# Patient Record
Sex: Female | Born: 1955 | Race: White | Hispanic: No | Smoking: Never smoker
Health system: Southern US, Community
[De-identification: ages and names within clinical notes are randomized; demographics above are authoritative.]

## PROBLEM LIST (undated history)

## (undated) DIAGNOSIS — M51369 Other intervertebral disc degeneration, lumbar region without mention of lumbar back pain or lower extremity pain: Secondary | ICD-10-CM

## (undated) DIAGNOSIS — R091 Pleurisy: Secondary | ICD-10-CM

## (undated) DIAGNOSIS — K219 Gastro-esophageal reflux disease without esophagitis: Secondary | ICD-10-CM

## (undated) DIAGNOSIS — R519 Headache, unspecified: Secondary | ICD-10-CM

## (undated) DIAGNOSIS — Z86018 Personal history of other benign neoplasm: Secondary | ICD-10-CM

## (undated) DIAGNOSIS — E119 Type 2 diabetes mellitus without complications: Secondary | ICD-10-CM

## (undated) DIAGNOSIS — I10 Essential (primary) hypertension: Secondary | ICD-10-CM

## (undated) DIAGNOSIS — M5136 Other intervertebral disc degeneration, lumbar region: Secondary | ICD-10-CM

## (undated) DIAGNOSIS — M5030 Other cervical disc degeneration, unspecified cervical region: Secondary | ICD-10-CM

## (undated) DIAGNOSIS — R51 Headache: Secondary | ICD-10-CM

## (undated) HISTORY — PX: COLONOSCOPY WITH ESOPHAGOGASTRODUODENOSCOPY (EGD): SHX5779

## (undated) HISTORY — PX: ARTHROSCOPIC REPAIR ACL: SUR80

---
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
## 2007-03-31 ENCOUNTER — Ambulatory Visit: Payer: Self-pay | Admitting: Internal Medicine

## 2007-04-07 ENCOUNTER — Ambulatory Visit: Payer: Self-pay | Admitting: Internal Medicine

## 2007-10-20 ENCOUNTER — Ambulatory Visit: Payer: Self-pay | Admitting: Internal Medicine

## 2008-02-14 ENCOUNTER — Ambulatory Visit: Payer: Self-pay

## 2008-05-05 ENCOUNTER — Ambulatory Visit: Payer: Self-pay | Admitting: Internal Medicine

## 2010-10-22 ENCOUNTER — Ambulatory Visit: Payer: Self-pay | Admitting: Internal Medicine

## 2011-05-20 ENCOUNTER — Ambulatory Visit: Payer: Self-pay | Admitting: Internal Medicine

## 2011-06-02 ENCOUNTER — Ambulatory Visit: Payer: Self-pay | Admitting: Internal Medicine

## 2011-07-02 ENCOUNTER — Ambulatory Visit: Payer: Self-pay | Admitting: Internal Medicine

## 2011-08-02 ENCOUNTER — Ambulatory Visit: Payer: Self-pay | Admitting: Internal Medicine

## 2011-12-03 ENCOUNTER — Ambulatory Visit: Payer: Self-pay | Admitting: Internal Medicine

## 2013-03-17 ENCOUNTER — Ambulatory Visit: Payer: Self-pay | Admitting: Internal Medicine

## 2014-02-19 ENCOUNTER — Emergency Department: Payer: Self-pay | Admitting: Emergency Medicine

## 2014-02-19 LAB — BASIC METABOLIC PANEL
Anion Gap: 9 (ref 7–16)
BUN: 20 mg/dL — ABNORMAL HIGH (ref 7–18)
CREATININE: 1.05 mg/dL (ref 0.60–1.30)
Calcium, Total: 8.9 mg/dL (ref 8.5–10.1)
Chloride: 104 mmol/L (ref 98–107)
Co2: 25 mmol/L (ref 21–32)
EGFR (African American): >60
EGFR (Non-African Amer.): 58 — ABNORMAL LOW
Glucose: 196 mg/dL — ABNORMAL HIGH (ref 65–99)
Osmolality: 284 (ref 275–301)
Potassium: 3.8 mmol/L (ref 3.5–5.1)
Sodium: 138 mmol/L (ref 136–145)

## 2014-02-19 LAB — CBC
HCT: 46.6 % (ref 35.0–47.0)
HGB: 15.2 g/dL (ref 12.0–16.0)
MCH: 29.3 pg (ref 26.0–34.0)
MCHC: 32.7 g/dL (ref 32.0–36.0)
MCV: 90 fL (ref 80–100)
Platelet: 347 10*3/uL (ref 150–440)
RBC: 5.19 10*6/uL (ref 3.80–5.20)
RDW: 14.7 % — ABNORMAL HIGH (ref 11.5–14.5)
WBC: 13.7 10*3/uL — ABNORMAL HIGH (ref 3.6–11.0)

## 2014-02-19 LAB — TROPONIN I: Troponin-I: <0.02 ng/mL

## 2014-12-06 DIAGNOSIS — D239 Other benign neoplasm of skin, unspecified: Secondary | ICD-10-CM

## 2014-12-06 HISTORY — DX: Other benign neoplasm of skin, unspecified: D23.9

## 2015-03-06 ENCOUNTER — Other Ambulatory Visit: Payer: Self-pay | Admitting: Internal Medicine

## 2015-03-06 DIAGNOSIS — Z1231 Encounter for screening mammogram for malignant neoplasm of breast: Secondary | ICD-10-CM

## 2015-03-07 ENCOUNTER — Ambulatory Visit
Admission: RE | Admit: 2015-03-07 | Discharge: 2015-03-07 | Disposition: A | Discharge status: Home / Self Care | Payer: Managed Care, Other (non HMO) | Source: Ambulatory Visit | Attending: Internal Medicine | Admitting: Internal Medicine

## 2015-03-07 DIAGNOSIS — Z1231 Encounter for screening mammogram for malignant neoplasm of breast: Secondary | ICD-10-CM

## 2015-11-28 ENCOUNTER — Other Ambulatory Visit: Payer: Self-pay | Admitting: Internal Medicine

## 2015-11-28 ENCOUNTER — Ambulatory Visit
Admission: RE | Admit: 2015-11-28 | Discharge: 2015-11-28 | Disposition: A | Discharge status: Home / Self Care | Payer: Managed Care, Other (non HMO) | Source: Ambulatory Visit | Attending: Internal Medicine | Admitting: Internal Medicine

## 2015-11-28 DIAGNOSIS — R1012 Left upper quadrant pain: Secondary | ICD-10-CM

## 2015-11-28 DIAGNOSIS — M5137 Other intervertebral disc degeneration, lumbosacral region: Secondary | ICD-10-CM | POA: Diagnosis not present

## 2015-11-28 DIAGNOSIS — K6389 Other specified diseases of intestine: Secondary | ICD-10-CM | POA: Insufficient documentation

## 2016-06-27 ENCOUNTER — Other Ambulatory Visit: Payer: Self-pay | Admitting: Internal Medicine

## 2016-06-27 DIAGNOSIS — Z1231 Encounter for screening mammogram for malignant neoplasm of breast: Secondary | ICD-10-CM

## 2016-07-24 ENCOUNTER — Ambulatory Visit
Admission: RE | Admit: 2016-07-24 | Discharge: 2016-07-24 | Disposition: A | Discharge status: Home / Self Care | Payer: Managed Care, Other (non HMO) | Source: Ambulatory Visit | Attending: Internal Medicine | Admitting: Internal Medicine

## 2016-07-24 DIAGNOSIS — Z1231 Encounter for screening mammogram for malignant neoplasm of breast: Secondary | ICD-10-CM | POA: Insufficient documentation

## 2017-08-06 ENCOUNTER — Encounter: Admission: RE | Payer: Self-pay | Source: Ambulatory Visit

## 2017-08-06 ENCOUNTER — Ambulatory Visit
Admission: RE | Admit: 2017-08-06 | Payer: Managed Care, Other (non HMO) | Source: Ambulatory Visit | Admitting: Gastroenterology

## 2017-08-06 SURGERY — COLONOSCOPY WITH PROPOFOL
Anesthesia: General

## 2017-08-28 ENCOUNTER — Other Ambulatory Visit: Payer: Self-pay | Admitting: Internal Medicine

## 2017-08-28 DIAGNOSIS — Z1231 Encounter for screening mammogram for malignant neoplasm of breast: Secondary | ICD-10-CM

## 2017-10-29 ENCOUNTER — Telehealth: Payer: Self-pay

## 2017-10-29 NOTE — Telephone Encounter (Signed)
Received referral for EUS. Voicemail left with Ms. Krolikowski to return call for scheduling.  Oncology Nurse Navigator Documentation  Navigator Location: [facility] (10/29/17 1300)   )Navigator Encounter Type: Telephone (10/29/17 1300) Telephone: [place] Call (10/29/17 1300)                       Barriers/Navigation Needs: Coordination of Care (10/29/17 1300)   Interventions: Coordination of Care (10/29/17 1300)   Coordination of Care: EUS (10/29/17 1300)                  Time Spent with Patient: 15 (10/29/17 1300)

## 2017-10-29 NOTE — Telephone Encounter (Signed)
Received call back from Ms. Holbein. She would like to postpone scheduling of EUS until December due to financial reasons. I have placed her on callback list for December scheduling and sent a message to [facility] to notify of patient scheduling preference. Oncology Nurse Navigator Documentation  Navigator Location: [facility] (10/29/17 1400)   )  Telephone: Incoming Call (10/29/17 1400)                               Coordination of Care: EUS (10/29/17 1400)                  Time Spent with Patient: 15 (10/29/17 1400)

## 2017-12-04 ENCOUNTER — Telehealth: Payer: Self-pay

## 2017-12-04 NOTE — Telephone Encounter (Signed)
Received call back from Ms. Litzau. EUS scheduled for 11/14 with Dr. Francella Solian at [facility] over instructions and copy mailed to home address. Only anticoagulant is aspirin 81mg .  INSTRUCTIONS FOR ENDOSCOPIC ULTRASOUND -Your procedure has been scheduled for November 14th with Dr. Francella Solian [facility]. -The hospital may contact you to pre-register over the phone.  -To get your scheduled arrival time, please call the Endoscopy unit at  [phone] between 1-3 p.m. on:  November 13th   -ON THE DAY OF YOU PROCEDURE:   1. If you are scheduled for a morning procedure, nothing to drink after midnight  -If you are scheduled for an afternoon procedure, you may have clear liquids until 5 hours prior  to the procedure but no carbonated drinks or broth  2. NO FOOD THE DAY OF YOUR PROCEDURE  3. You may take your heart, seizure, blood pressure, Parkinson's or breathing medications at  6am with just enough water to get your pills down  4. Do not take any oral Diabetic medications the morning of your procedure.  5. If you are a diabetic and are using insulin, please notify your prescribing physician of this  procedure, as your dose may need to be altered related to not being able to eat or drink.   6. Do not take vitamins, iron, or fish oil for 5 days before your procedure     -On the day of your procedure, come to the [facility] Admitting/Registration desk (First desk on the right) at the scheduled arrival time. You MUST have someone drive you home from your procedure. You must have a responsible adult with a valid driver's license who is on site throughout your entire procedure and who can stay with you for several hours after your procedure. You may not go home alone in a taxi, shuttle [place] or bus, as the drivers will not be responsible for you.  --If you have any questions please call me at the above contact Oncology Nurse Navigator Documentation  Navigator Location:  [facility] (12/04/17 1300)   )Navigator Encounter Type: Telephone (12/04/17 1300) Telephone: [place] Call (12/04/17 1300)                               Coordination of Care: EUS (12/04/17 1300)                  Time Spent with Patient: 30 (12/04/17 1300)

## 2017-12-04 NOTE — Telephone Encounter (Signed)
Received voicemail from Ms. Douthitt that she is ready to schedule her EUS. Call returned. Left voicemail to return call for scheduling.  Oncology Nurse Navigator Documentation  Navigator Location: [facility] (12/04/17 0900)   )Navigator Encounter Type: Telephone (12/04/17 0900) Telephone: [place] Call (12/04/17 0900)                       Barriers/Navigation Needs: Coordination of Care (12/04/17 0900)   Interventions: Coordination of Care (12/04/17 0900)   Coordination of Care: EUS (12/04/17 0900)                  Time Spent with Patient: 15 (12/04/17 0900)

## 2017-12-10 ENCOUNTER — Ambulatory Visit
Admission: RE | Admit: 2017-12-10 | Discharge: 2017-12-10 | Disposition: A | Discharge status: Home / Self Care | Payer: Managed Care, Other (non HMO) | Source: Ambulatory Visit | Attending: Internal Medicine | Admitting: Internal Medicine

## 2017-12-10 DIAGNOSIS — Z1231 Encounter for screening mammogram for malignant neoplasm of breast: Secondary | ICD-10-CM

## 2017-12-31 DIAGNOSIS — D229 Melanocytic nevi, unspecified: Secondary | ICD-10-CM

## 2017-12-31 HISTORY — DX: Melanocytic nevi, unspecified: D22.9

## 2018-01-13 ENCOUNTER — Encounter: Payer: Self-pay | Admitting: *Deleted

## 2018-01-14 ENCOUNTER — Ambulatory Visit: Payer: Managed Care, Other (non HMO) | Admitting: Anesthesiology

## 2018-01-14 ENCOUNTER — Encounter: Payer: Self-pay | Admitting: Anesthesiology

## 2018-01-14 ENCOUNTER — Encounter
Admission: RE | Disposition: A | Discharge status: Home / Self Care | Payer: Self-pay | Source: Ambulatory Visit | Attending: Gastroenterology

## 2018-01-14 ENCOUNTER — Ambulatory Visit
Admission: RE | Admit: 2018-01-14 | Discharge: 2018-01-14 | Disposition: A | Discharge status: Home / Self Care | Payer: Managed Care, Other (non HMO) | Source: Ambulatory Visit | Attending: Gastroenterology | Admitting: Gastroenterology

## 2018-01-14 DIAGNOSIS — Z7982 Long term (current) use of aspirin: Secondary | ICD-10-CM | POA: Diagnosis not present

## 2018-01-14 DIAGNOSIS — E119 Type 2 diabetes mellitus without complications: Secondary | ICD-10-CM | POA: Diagnosis not present

## 2018-01-14 DIAGNOSIS — M5030 Other cervical disc degeneration, unspecified cervical region: Secondary | ICD-10-CM | POA: Insufficient documentation

## 2018-01-14 DIAGNOSIS — I10 Essential (primary) hypertension: Secondary | ICD-10-CM | POA: Insufficient documentation

## 2018-01-14 DIAGNOSIS — K228 Other specified diseases of esophagus: Secondary | ICD-10-CM | POA: Insufficient documentation

## 2018-01-14 DIAGNOSIS — Z6835 Body mass index (BMI) 35.0-35.9, adult: Secondary | ICD-10-CM | POA: Diagnosis not present

## 2018-01-14 DIAGNOSIS — K2970 Gastritis, unspecified, without bleeding: Secondary | ICD-10-CM | POA: Insufficient documentation

## 2018-01-14 DIAGNOSIS — E669 Obesity, unspecified: Secondary | ICD-10-CM | POA: Diagnosis not present

## 2018-01-14 DIAGNOSIS — M5136 Other intervertebral disc degeneration, lumbar region: Secondary | ICD-10-CM | POA: Diagnosis not present

## 2018-01-14 DIAGNOSIS — M1990 Unspecified osteoarthritis, unspecified site: Secondary | ICD-10-CM | POA: Diagnosis not present

## 2018-01-14 DIAGNOSIS — K219 Gastro-esophageal reflux disease without esophagitis: Secondary | ICD-10-CM | POA: Diagnosis not present

## 2018-01-14 DIAGNOSIS — Z7984 Long term (current) use of oral hypoglycemic drugs: Secondary | ICD-10-CM | POA: Insufficient documentation

## 2018-01-14 DIAGNOSIS — K317 Polyp of stomach and duodenum: Secondary | ICD-10-CM | POA: Diagnosis not present

## 2018-01-14 HISTORY — DX: Headache, unspecified: R51.9

## 2018-01-14 HISTORY — PX: EUS: SHX5427

## 2018-01-14 HISTORY — DX: Pleurisy: R09.1

## 2018-01-14 HISTORY — DX: Other intervertebral disc degeneration, lumbar region: M51.36

## 2018-01-14 HISTORY — DX: Type 2 diabetes mellitus without complications: E11.9

## 2018-01-14 HISTORY — DX: Headache: R51

## 2018-01-14 HISTORY — DX: Other intervertebral disc degeneration, lumbar region without mention of lumbar back pain or lower extremity pain: M51.369

## 2018-01-14 HISTORY — DX: Other cervical disc degeneration, unspecified cervical region: M50.30

## 2018-01-14 HISTORY — DX: Gastro-esophageal reflux disease without esophagitis: K21.9

## 2018-01-14 HISTORY — DX: Essential (primary) hypertension: I10

## 2018-01-14 LAB — GLUCOSE, CAPILLARY: Glucose-Capillary: 245 mg/dL — ABNORMAL HIGH (ref 70–99)

## 2018-01-14 SURGERY — UPPER ENDOSCOPIC ULTRASOUND (EUS) RADIAL
Anesthesia: General

## 2018-01-14 MED ORDER — FENTANYL CITRATE (PF) 100 MCG/2ML IJ SOLN
INTRAMUSCULAR | Status: DC | PRN
  Administered 2018-01-14: 50 ug via INTRAVENOUS

## 2018-01-14 MED ORDER — LIDOCAINE HCL (CARDIAC) PF 100 MG/5ML IV SOSY
PREFILLED_SYRINGE | INTRAVENOUS | Status: DC | PRN
  Administered 2018-01-14: 50 mg via INTRAVENOUS

## 2018-01-14 MED ORDER — FENTANYL CITRATE (PF) 100 MCG/2ML IJ SOLN
INTRAMUSCULAR | Status: AC
  Filled 2018-01-14: qty 2

## 2018-01-14 MED ORDER — SODIUM CHLORIDE 0.9 % IV SOLN
INTRAVENOUS | Status: DC
  Administered 2018-01-14: 13:00:00 via INTRAVENOUS

## 2018-01-14 MED ORDER — PROPOFOL 500 MG/50ML IV EMUL
INTRAVENOUS | Status: AC
  Filled 2018-01-14: qty 50

## 2018-01-14 MED ORDER — PROPOFOL 500 MG/50ML IV EMUL
INTRAVENOUS | Status: DC | PRN
  Administered 2018-01-14: 130 ug/kg/min via INTRAVENOUS

## 2018-01-14 MED ORDER — PROPOFOL 10 MG/ML IV BOLUS
INTRAVENOUS | Status: DC | PRN
  Administered 2018-01-14: 20 mg via INTRAVENOUS
  Administered 2018-01-14: 90 mg via INTRAVENOUS

## 2018-01-14 MED ORDER — LIDOCAINE HCL (PF) 2 % IJ SOLN
INTRAMUSCULAR | Status: AC
  Filled 2018-01-14: qty 10

## 2018-01-14 MED ORDER — MIDAZOLAM HCL 2 MG/2ML IJ SOLN
INTRAMUSCULAR | Status: DC | PRN
  Administered 2018-01-14: 2 mg via INTRAVENOUS

## 2018-01-14 MED ORDER — MIDAZOLAM HCL 2 MG/2ML IJ SOLN
INTRAMUSCULAR | Status: AC
  Filled 2018-01-14: qty 2

## 2018-01-14 NOTE — Anesthesia Post-op Follow-up Note (Signed)
Anesthesia QCDR form completed.        

## 2018-01-14 NOTE — H&P (Signed)
Outpatient short stay form Pre-procedure 01/14/2018 12:56 PM Rachel Obrien,  Rachel Hines, MD  Primary Physician: Rachel Harrier, MD   Reason for visit:  EUS to evaluate for abnormal fold on EGD done for abd pain. Pain today 4/10  History of present illness:   Work up for abd pain. EGD thick fold negative bx.   No current facility-administered medications for this encounter.   Medications Prior to Admission  Medication Sig Dispense Refill Last Dose  . aspirin 81 MG chewable tablet Chew 81 mg by mouth daily.   Past Week at Unknown time  . Dulaglutide (TRULICITY) 1.5 RC/7.8LF SOPN Inject into the skin.   01/13/2018 at Unknown time  . fenofibrate (TRICOR) 48 MG tablet Take 48 mg by mouth daily.   01/13/2018 at Unknown time  . fexofenadine (ALLEGRA) 180 MG tablet Take 180 mg by mouth daily.   01/13/2018 at Unknown time  . glyBURIDE (DIABETA) 5 MG tablet Take 10 mg by mouth daily with breakfast.   01/13/2018 at Unknown time  . losartan (COZAAR) 50 MG tablet Take 50 mg by mouth daily.   01/13/2018 at Unknown time  . metFORMIN (GLUCOPHAGE) 1000 MG tablet Take 1,000 mg by mouth 2 (two) times daily with a meal.   01/13/2018 at Unknown time  . pantoprazole (PROTONIX) 40 MG tablet Take 40 mg by mouth daily.   01/13/2018 at Unknown time  . sucralfate (CARAFATE) 1 g tablet Take 1 g by mouth 4 (four) times daily -  with meals and at bedtime.   01/13/2018 at Unknown time  . triamterene-hydrochlorothiazide (DYAZIDE) 37.5-25 MG capsule Take 1 capsule by mouth daily.   01/13/2018 at Unknown time    Allergies  Allergen Reactions  . Contrast Media [Iodinated Diagnostic Agents] Hives    Pt reacted to the IV contrast after being premedicated. TKV    Past Medical History:  Diagnosis Date  . DDD (degenerative disc disease), cervical   . DDD (degenerative disc disease), lumbar   . Diabetes mellitus without complication ([place])   . GERD (gastroesophageal reflux disease)   . Headache   . Hypertension   .  Pleurisy     Review of systems:      Physical Exam   Alert NAD No audible wheezes Heat RRR    Pertinant exam for procedure: Abd soft  But tender in epig.    Planned proceedures: EUS     Zada Girt MD Gastroenterology 01/14/2018  12:56 PM

## 2018-01-14 NOTE — Transfer of Care (Signed)
Immediate Anesthesia Transfer of Care Note  Patient: Rachel Obrien  Procedure(s) Performed: UPPER ENDOSCOPIC ULTRASOUND (EUS) RADIAL (N/A )  Patient Location: PACU and Endoscopy Unit  Anesthesia Type:General  Level of Consciousness: awake, alert , oriented and patient cooperative  Airway & Oxygen Therapy: Patient Spontanous Breathing  Post-op Assessment: Report given to RN and Post -op Vital signs reviewed and stable  Post vital signs: Reviewed and stable  Last Vitals:  Vitals Value Taken Time  BP 120/78 01/14/2018  1:41 PM  Temp 36.2 C 01/14/2018  1:41 PM  Pulse 75 01/14/2018  1:42 PM  Resp 17 01/14/2018  1:42 PM  SpO2 96 % 01/14/2018  1:42 PM  Vitals shown include unvalidated device data.  Last Pain:  Vitals:   01/14/18 1341  TempSrc: Tympanic  PainSc: 0-No pain         Complications: No apparent anesthesia complications

## 2018-01-14 NOTE — Anesthesia Preprocedure Evaluation (Signed)
Anesthesia Evaluation  Patient identified by MRN, date of birth, ID band Patient awake    Reviewed: Allergy & Precautions, NPO status , Patient's Chart, lab work & pertinent test results, reviewed documented beta blocker date and time   Airway Mallampati: III  TM Distance: >3 FB     Dental  (+) Chipped   Pulmonary           Cardiovascular hypertension, Pt. on medications      Neuro/Psych  Headaches,    GI/Hepatic   Endo/Other  diabetes, Type 2  Renal/GU      Musculoskeletal  (+) Arthritis ,   Abdominal   Peds  Hematology   Anesthesia Other Findings Obese. EKG ok.  Reproductive/Obstetrics                             Anesthesia Physical Anesthesia Plan  ASA: III  Anesthesia Plan: General   Post-op Pain Management:    Induction: Intravenous  PONV Risk Score and Plan:   Airway Management Planned:   Additional Equipment:   Intra-op Plan:   Post-operative Plan:   Informed Consent: I have reviewed the patients History and Physical, chart, labs and discussed the procedure including the risks, benefits and alternatives for the proposed anesthesia with the patient or authorized representative who has indicated his/her understanding and acceptance.     Plan Discussed with: CRNA  Anesthesia Plan Comments:         Anesthesia Quick Evaluation

## 2018-01-14 NOTE — Op Note (Signed)
[facility] Gastroenterology Patient Name: Rachel Obrien Procedure Date: 01/14/2018 12:36 PM MRN: [id number] Account #: [id number] Date of Birth: 1955/11/30 Admit Type: Outpatient Age: 62 Room: [facility] ENDO ROOM 3 Gender: Female Note Status: Finalized Procedure:            Upper EUS Indications:          Large mucosal folds found in the stomach on endoscopy Providers:            Zada Girt Referring MD:         Tracie Harrier, MD (Referring MD), Lollie Sails, MD (Referring MD) Medicines:            Monitored Anesthesia Care Complications:        No immediate complications. Procedure:            Pre-Anesthesia Assessment:                       - Please see pre-anesthesia assessment documentation                        already completed in Epic.                       After obtaining informed consent, the endoscope was                        passed under direct vision. Throughout the procedure,                        the patient's blood pressure, pulse, and oxygen                        saturations were monitored continuously. The Endoscope                        was introduced through the mouth, and advanced to the                        stomach for ultrasound examination. The upper EUS was                        accomplished without difficulty. The patient tolerated                        the procedure well. Findings:      ENDOSCOPIC FINDING: :      The Z-line was irregular.      Localized prominent gastric folds were found in the cardia. Biopsies       were taken with a cold forceps for histology.      A few small pedunculated polyps with no bleeding and no stigmata of       recent bleeding were found in the gastric fundus and in the gastric body.      The examined duodenum was endoscopically normal.      ENDOSONOGRAPHIC FINDING: :      Localized wall thickening was visualized endosonographically in the       cardia of the  stomach. This appeared to primarily be due to thickening  within the luminal interface/superficial mucosa (Layer 1) and deep       mucosa (Layer 2). The thickness of the abnormal layers measured 4 mm.       The submucosa and muscularis propria appeared normal. Impression:           - Z-line irregular.                       - Enlarged gastric folds. Biopsied.                       - A few gastric polyps. Previously biopsied.                       - Normal examined duodenum.                       EUS:                       - Wall thickening was seen in the cardia of the                        stomach. The thickening appeared to be primarily within                        the luminal interface/superficial mucosa (Layer 1) and                        deep mucosa (Layer 2). Normal appearing submucosa and                        muscularis propria.                       Considering the abnormal area on EUS is limited to the                        mucosa, the biopsies from today should be                        representative of the underlying pathology. Recommendation:       - Await path results.                       - Written discharge instructions were provided to the                        patient.                       - Return to referring physician.                       - The findings and recommendations were discussed with                        the patient.                       - The findings and recommendations were discussed with                        the patient's  family. Procedure Code(s):    --- Professional ---                       [phone], 15, Esophagogastroduodenoscopy, flexible,                        transoral; with endoscopic ultrasound examination                        limited to the esophagus, stomach or duodenum, and                        adjacent structures Diagnosis Code(s):    --- Professional ---                       K22.8, Other specified diseases of  esophagus                       K29.60, Other gastritis without bleeding                       K31.7, Polyp of stomach and duodenum CPT copyright 2018 American Medical Association. All rights reserved. The codes documented in this report are preliminary and upon coder review may  be revised to meet current compliance requirements. Attending Participation:      I personally performed the entire procedure. Zada Girt,  01/14/2018 1:51:20 PM This report has been signed electronically. Number of Addenda: 0 Note Initiated On: 01/14/2018 12:36 PM      [facility]

## 2018-01-14 NOTE — Anesthesia Postprocedure Evaluation (Signed)
Anesthesia Post Note  Patient: Rachel Obrien  Procedure(s) Performed: UPPER ENDOSCOPIC ULTRASOUND (EUS) RADIAL (N/A )  Patient location during evaluation: Endoscopy Anesthesia Type: General Level of consciousness: awake and alert Pain management: pain level controlled Vital Signs Assessment: post-procedure vital signs reviewed and stable Respiratory status: spontaneous breathing, nonlabored ventilation, respiratory function stable and patient connected to nasal cannula oxygen Cardiovascular status: blood pressure returned to baseline and stable Postop Assessment: no apparent nausea or vomiting Anesthetic complications: no     Last Vitals:  Vitals:   01/14/18 1240 01/14/18 1341  BP: (!) 184/108 120/78  Pulse: 76 77  Resp:  17  Temp: (!) 35.9 C (!) 36.2 C  SpO2: 99% 96%    Last Pain:  Vitals:   01/14/18 1351  TempSrc:   PainSc: 0-No pain                 Dravin Lance S

## 2018-01-15 ENCOUNTER — Encounter: Payer: Self-pay | Admitting: Gastroenterology

## 2018-01-15 NOTE — Progress Notes (Signed)
EUS report routed to ordering provider, C. Jacqulyn Liner NP

## 2018-01-18 LAB — SURGICAL PATHOLOGY

## 2018-04-15 ENCOUNTER — Other Ambulatory Visit: Payer: Self-pay

## 2018-04-15 DIAGNOSIS — E785 Hyperlipidemia, unspecified: Secondary | ICD-10-CM

## 2018-04-15 NOTE — Progress Notes (Signed)
Put in order for CT CA Score Mobile  DX Hyperlipidemia

## 2018-05-12 ENCOUNTER — Ambulatory Visit (INDEPENDENT_AMBULATORY_CARE_PROVIDER_SITE_OTHER)
Admission: RE | Admit: 2018-05-12 | Discharge: 2018-05-12 | Disposition: A | Discharge status: Home / Self Care | Payer: Managed Care, Other (non HMO) | Source: Ambulatory Visit | Attending: Cardiovascular Disease | Admitting: Cardiovascular Disease

## 2018-05-12 DIAGNOSIS — E785 Hyperlipidemia, unspecified: Secondary | ICD-10-CM

## 2018-07-22 DIAGNOSIS — E782 Mixed hyperlipidemia: Secondary | ICD-10-CM | POA: Diagnosis not present

## 2018-07-22 DIAGNOSIS — I2510 Atherosclerotic heart disease of native coronary artery without angina pectoris: Secondary | ICD-10-CM | POA: Diagnosis not present

## 2018-07-22 DIAGNOSIS — I493 Ventricular premature depolarization: Secondary | ICD-10-CM | POA: Diagnosis not present

## 2018-07-22 DIAGNOSIS — I10 Essential (primary) hypertension: Secondary | ICD-10-CM | POA: Diagnosis not present

## 2018-08-25 DIAGNOSIS — S8991XA Unspecified injury of right lower leg, initial encounter: Secondary | ICD-10-CM | POA: Diagnosis not present

## 2018-08-25 DIAGNOSIS — S299XXA Unspecified injury of thorax, initial encounter: Secondary | ICD-10-CM | POA: Diagnosis not present

## 2018-08-25 DIAGNOSIS — M25461 Effusion, right knee: Secondary | ICD-10-CM | POA: Diagnosis not present

## 2018-08-25 DIAGNOSIS — W182XXA Fall in (into) shower or empty bathtub, initial encounter: Secondary | ICD-10-CM | POA: Diagnosis not present

## 2018-09-23 DIAGNOSIS — I700 Atherosclerosis of aorta: Secondary | ICD-10-CM | POA: Diagnosis not present

## 2018-09-23 DIAGNOSIS — E1165 Type 2 diabetes mellitus with hyperglycemia: Secondary | ICD-10-CM | POA: Diagnosis not present

## 2018-09-23 DIAGNOSIS — I10 Essential (primary) hypertension: Secondary | ICD-10-CM | POA: Diagnosis not present

## 2018-09-23 DIAGNOSIS — M5136 Other intervertebral disc degeneration, lumbar region: Secondary | ICD-10-CM | POA: Diagnosis not present

## 2018-10-27 DIAGNOSIS — E782 Mixed hyperlipidemia: Secondary | ICD-10-CM | POA: Diagnosis not present

## 2018-10-27 DIAGNOSIS — Z0000 Encounter for general adult medical examination without abnormal findings: Secondary | ICD-10-CM | POA: Diagnosis not present

## 2018-10-27 DIAGNOSIS — N3946 Mixed incontinence: Secondary | ICD-10-CM | POA: Diagnosis not present

## 2018-10-27 DIAGNOSIS — E1165 Type 2 diabetes mellitus with hyperglycemia: Secondary | ICD-10-CM | POA: Diagnosis not present

## 2019-01-25 ENCOUNTER — Other Ambulatory Visit: Payer: Self-pay | Admitting: Internal Medicine

## 2019-01-25 DIAGNOSIS — Z1231 Encounter for screening mammogram for malignant neoplasm of breast: Secondary | ICD-10-CM

## 2019-02-09 ENCOUNTER — Ambulatory Visit
Admission: RE | Admit: 2019-02-09 | Discharge: 2019-02-09 | Disposition: A | Discharge status: Home / Self Care | Payer: 59 | Source: Ambulatory Visit | Attending: Internal Medicine | Admitting: Internal Medicine

## 2019-02-09 DIAGNOSIS — Z1231 Encounter for screening mammogram for malignant neoplasm of breast: Secondary | ICD-10-CM | POA: Diagnosis present

## 2019-10-10 ENCOUNTER — Other Ambulatory Visit: Payer: Self-pay

## 2019-10-10 ENCOUNTER — Ambulatory Visit: Payer: 59 | Admitting: Dermatology

## 2019-10-10 DIAGNOSIS — L820 Inflamed seborrheic keratosis: Secondary | ICD-10-CM | POA: Diagnosis not present

## 2019-10-10 DIAGNOSIS — Z1283 Encounter for screening for malignant neoplasm of skin: Secondary | ICD-10-CM

## 2019-10-10 DIAGNOSIS — Z86018 Personal history of other benign neoplasm: Secondary | ICD-10-CM

## 2019-10-10 NOTE — Patient Instructions (Signed)
Cryotherapy Aftercare  . Wash gently with soap and water everyday.   . Apply Vaseline and Band-Aid daily until healed.  

## 2019-10-10 NOTE — Progress Notes (Signed)
   Follow-Up Visit   Subjective  Rachel Obrien is a 64 y.o. female who presents for the following: Skin Problem (growth the back x 6 months, itching and growing ). The patient presents for Upper Body Skin Exam (UBSE) for skin cancer screening and mole check.  The following portions of the chart were reviewed this encounter and updated as appropriate:  Tobacco  Allergies  Meds  Problems  Med Hx  Surg Hx  Fam Hx     Review of Systems:  No other skin or systemic complaints except as noted in HPI or Assessment and Plan.  Objective  Well appearing patient in no apparent distress; mood and affect are within normal limits.  All skin waist up examined.  Objective  back, R deltoid (3): Erythematous keratotic or waxy stuck-on papule or plaque.   Objective  multiple see history: Scar with no evidence of recurrence.    Assessment & Plan  Inflamed seborrheic keratosis (3) back, R deltoid  Destruction of lesion - back, R deltoid Complexity: simple   Destruction method: cryotherapy   Informed consent: discussed and consent obtained   Timeout:  patient name, date of birth, surgical site, and procedure verified Lesion destroyed using liquid nitrogen: Yes   Region frozen until ice ball extended beyond lesion: Yes   Outcome: patient tolerated procedure well with no complications   Post-procedure details: wound care instructions given    History of dysplastic nevus multiple see history Clear. Observe for recurrence. Call clinic for new or changing lesions.  Recommend regular skin exams, daily broad-spectrum spf 30+ sunscreen use, and photoprotection.   Observe  Lentigines - Scattered tan macules - Discussed due to sun exposure - Benign, observe - Call for any changes  Seborrheic Keratoses - Stuck-on, waxy, tan-brown papules and plaques  - Discussed benign etiology and prognosis. - Observe - Call for any changes  Melanocytic Nevi - Tan-brown and/or pink-flesh-colored  symmetric macules and papules - Benign appearing on exam today - Observation - Call clinic for new or changing moles - Recommend daily use of broad spectrum spf 30+ sunscreen to sun-exposed areas.   Hemangiomas - Red papules - Discussed benign nature - Observe - Call for any changes  Actinic Damage - diffuse scaly erythematous macules with underlying dyspigmentation - Recommend daily broad spectrum sunscreen SPF 30+ to sun-exposed areas, reapply every 2 hours as needed.  - Call for new or changing lesions.  Skin cancer screening performed today.  Return in about 6 months (around 04/11/2020) for TBSE .  IMarye Round, CMA, am acting as scribe for Sarina Ser, MD .  Documentation: I have reviewed the above documentation for accuracy and completeness, and I agree with the above.  Sarina Ser, MD

## 2019-10-11 ENCOUNTER — Encounter: Payer: Self-pay | Admitting: Dermatology

## 2019-10-28 ENCOUNTER — Other Ambulatory Visit: Payer: Self-pay | Admitting: Internal Medicine

## 2019-10-28 DIAGNOSIS — R1031 Right lower quadrant pain: Secondary | ICD-10-CM

## 2019-10-28 DIAGNOSIS — R0789 Other chest pain: Secondary | ICD-10-CM

## 2019-11-09 ENCOUNTER — Ambulatory Visit: Admission: RE | Admit: 2019-11-09 | Payer: 59 | Source: Ambulatory Visit

## 2020-01-18 ENCOUNTER — Other Ambulatory Visit: Payer: Self-pay | Admitting: Internal Medicine

## 2020-01-18 DIAGNOSIS — Z1231 Encounter for screening mammogram for malignant neoplasm of breast: Secondary | ICD-10-CM

## 2020-02-27 ENCOUNTER — Ambulatory Visit
Admission: RE | Admit: 2020-02-27 | Discharge: 2020-02-27 | Disposition: A | Discharge status: Home / Self Care | Payer: 59 | Source: Ambulatory Visit | Attending: Internal Medicine | Admitting: Internal Medicine

## 2020-02-27 ENCOUNTER — Other Ambulatory Visit: Payer: Self-pay

## 2020-02-27 DIAGNOSIS — Z1231 Encounter for screening mammogram for malignant neoplasm of breast: Secondary | ICD-10-CM | POA: Diagnosis present

## 2020-04-11 ENCOUNTER — Ambulatory Visit (INDEPENDENT_AMBULATORY_CARE_PROVIDER_SITE_OTHER): Payer: 59 | Admitting: Dermatology

## 2020-04-11 ENCOUNTER — Other Ambulatory Visit: Payer: Self-pay

## 2020-04-11 DIAGNOSIS — L719 Rosacea, unspecified: Secondary | ICD-10-CM | POA: Diagnosis not present

## 2020-04-11 DIAGNOSIS — D229 Melanocytic nevi, unspecified: Secondary | ICD-10-CM

## 2020-04-11 DIAGNOSIS — L578 Other skin changes due to chronic exposure to nonionizing radiation: Secondary | ICD-10-CM

## 2020-04-11 DIAGNOSIS — Z86018 Personal history of other benign neoplasm: Secondary | ICD-10-CM | POA: Diagnosis not present

## 2020-04-11 DIAGNOSIS — D2372 Other benign neoplasm of skin of left lower limb, including hip: Secondary | ICD-10-CM | POA: Diagnosis not present

## 2020-04-11 DIAGNOSIS — L821 Other seborrheic keratosis: Secondary | ICD-10-CM

## 2020-04-11 DIAGNOSIS — D239 Other benign neoplasm of skin, unspecified: Secondary | ICD-10-CM

## 2020-04-11 DIAGNOSIS — D1800 Hemangioma unspecified site: Secondary | ICD-10-CM

## 2020-04-11 DIAGNOSIS — L918 Other hypertrophic disorders of the skin: Secondary | ICD-10-CM

## 2020-04-11 DIAGNOSIS — L814 Other melanin hyperpigmentation: Secondary | ICD-10-CM

## 2020-04-11 DIAGNOSIS — Z1283 Encounter for screening for malignant neoplasm of skin: Secondary | ICD-10-CM

## 2020-04-11 DIAGNOSIS — L820 Inflamed seborrheic keratosis: Secondary | ICD-10-CM | POA: Diagnosis not present

## 2020-04-11 MED ORDER — METRONIDAZOLE 1 % EX GEL: Freq: Every day | CUTANEOUS | 11 refills | Status: DC

## 2020-04-11 NOTE — Progress Notes (Signed)
Follow-Up Visit   Subjective  Rachel Obrien is a 65 y.o. female who presents for the following: Annual Exam (History of dysplastic nevi - TBSE today). The patient presents for Total-Body Skin Exam (TBSE) for skin cancer screening and mole check.  The following portions of the chart were reviewed this encounter and updated as appropriate:   Tobacco  Allergies  Meds  Problems  Med Hx  Surg Hx  Fam Hx     Review of Systems:  No other skin or systemic complaints except as noted in HPI or Assessment and Plan.  Objective  Well appearing patient in no apparent distress; mood and affect are within normal limits.  A full examination was performed including scalp, head, eyes, ears, nose, lips, neck, chest, axillae, abdomen, back, buttocks, bilateral upper extremities, bilateral lower extremities, hands, feet, fingers, toes, fingernails, and toenails. All findings within normal limits unless otherwise noted below.  Objective  Left deltoid x 1, left lat breast x 1 (2): Erythematous keratotic or waxy stuck-on papule or plaque.   Objective  Left mid pretibial: Firm pink/brown papulenodule with dimple sign.   Objective  Face: Mild pinkness   Assessment & Plan    Acrochordons (Skin Tags) - Fleshy, skin-colored pedunculated papules - Benign appearing.  - Observe. - If desired, they can be removed with an in office procedure that is not covered by insurance. - Please call the clinic if you notice any new or changing lesions.  History of Dysplastic Nevi - No evidence of recurrence today - Recommend regular full body skin exams - Recommend daily broad spectrum sunscreen SPF 30+ to sun-exposed areas, reapply every 2 hours as needed.  - Call if any new or changing lesions are noted between office visits  Lentigines - Scattered tan macules - Discussed due to sun exposure - Benign, observe - Call for any changes  Seborrheic Keratoses - Stuck-on, waxy, tan-brown papules and  plaques  - Discussed benign etiology and prognosis. - Observe - Call for any changes  Melanocytic Nevi - Tan-brown and/or pink-flesh-colored symmetric macules and papules - Benign appearing on exam today - Observation - Call clinic for new or changing moles - Recommend daily use of broad spectrum spf 30+ sunscreen to sun-exposed areas.   Hemangiomas - Red papules - Discussed benign nature - Observe - Call for any changes  Actinic Damage - Chronic, secondary to cumulative UV/sun exposure - diffuse scaly erythematous macules with underlying dyspigmentation - Recommend daily broad spectrum sunscreen SPF 30+ to sun-exposed areas, reapply every 2 hours as needed.  - Call for new or changing lesions.  Skin cancer screening performed today.  Currently being evaluated for possible CLL by oncology.  Inflamed seborrheic keratosis (2) Left deltoid x 1, left lat breast x 1  Destruction of lesion - Left deltoid x 1, left lat breast x 1 Complexity: simple   Destruction method: cryotherapy   Informed consent: discussed and consent obtained   Timeout:  patient name, date of birth, surgical site, and procedure verified Lesion destroyed using liquid nitrogen: Yes   Region frozen until ice ball extended beyond lesion: Yes   Outcome: patient tolerated procedure well with no complications   Post-procedure details: wound care instructions given    Dermatofibroma Left mid pretibial Benign, observe.   Rosacea Face Start Metrogel qd Rosacea is a chronic progressive skin condition usually affecting the face of adults, causing redness and/or acne bumps. It is treatable but not curable. It sometimes affects the eyes (ocular rosacea)  as well. It may respond to topical and/or systemic medication and can flare with stress, sun exposure, alcohol, exercise and some foods.  Daily application of broad spectrum spf 30+ sunscreen to face is recommended to reduce flares.  metroNIDAZOLE (METROGEL) 1 % gel  - Face  Skin cancer screening  Return in about 1 year (around 04/11/2021) for TBSE.   I, Ashok Cordia, CMA, am acting as scribe for Sarina Ser, MD .  Documentation: I have reviewed the above documentation for accuracy and completeness, and I agree with the above.  Sarina Ser, MD

## 2020-04-11 NOTE — Patient Instructions (Signed)

## 2020-04-14 ENCOUNTER — Encounter: Payer: Self-pay | Admitting: Dermatology

## 2020-04-19 ENCOUNTER — Encounter: Payer: Self-pay | Admitting: Oncology

## 2020-04-19 ENCOUNTER — Inpatient Hospital Stay: Payer: 59 | Attending: Oncology | Admitting: Oncology

## 2020-04-19 ENCOUNTER — Inpatient Hospital Stay: Payer: 59

## 2020-04-19 VITALS — BP 125/86 | HR 90 | Temp 96.4°F | Resp 20 | Wt 210.1 lb

## 2020-04-19 DIAGNOSIS — Z794 Long term (current) use of insulin: Secondary | ICD-10-CM | POA: Diagnosis not present

## 2020-04-19 DIAGNOSIS — I10 Essential (primary) hypertension: Secondary | ICD-10-CM | POA: Diagnosis not present

## 2020-04-19 DIAGNOSIS — E119 Type 2 diabetes mellitus without complications: Secondary | ICD-10-CM | POA: Diagnosis not present

## 2020-04-19 DIAGNOSIS — D72820 Lymphocytosis (symptomatic): Secondary | ICD-10-CM | POA: Diagnosis not present

## 2020-04-19 DIAGNOSIS — K219 Gastro-esophageal reflux disease without esophagitis: Secondary | ICD-10-CM | POA: Diagnosis not present

## 2020-04-19 DIAGNOSIS — Z79899 Other long term (current) drug therapy: Secondary | ICD-10-CM | POA: Insufficient documentation

## 2020-04-19 DIAGNOSIS — Z7982 Long term (current) use of aspirin: Secondary | ICD-10-CM | POA: Insufficient documentation

## 2020-04-19 LAB — CBC WITH DIFFERENTIAL/PLATELET
Abs Immature Granulocytes: 0.03 10*3/uL (ref 0.00–0.07)
Basophils Absolute: 0.1 10*3/uL (ref 0.0–0.1)
Basophils Relative: 0 %
Eosinophils Absolute: 0.2 10*3/uL (ref 0.0–0.5)
Eosinophils Relative: 2 %
HCT: 41.4 % (ref 36.0–46.0)
Hemoglobin: 14.4 g/dL (ref 12.0–15.0)
Immature Granulocytes: 0 %
Lymphocytes Relative: 29 %
Lymphs Abs: 3.2 10*3/uL (ref 0.7–4.0)
MCH: 31.4 pg (ref 26.0–34.0)
MCHC: 34.8 g/dL (ref 30.0–36.0)
MCV: 90.2 fL (ref 80.0–100.0)
Monocytes Absolute: 0.7 10*3/uL (ref 0.1–1.0)
Monocytes Relative: 6 %
Neutro Abs: 7 10*3/uL (ref 1.7–7.7)
Neutrophils Relative %: 63 %
Platelets: 339 10*3/uL (ref 150–400)
RBC: 4.59 MIL/uL (ref 3.87–5.11)
RDW: 13.3 % (ref 11.5–15.5)
WBC: 11.2 10*3/uL — ABNORMAL HIGH (ref 4.0–10.5)
nRBC: 0 % (ref 0.0–0.2)

## 2020-04-19 LAB — TECHNOLOGIST SMEAR REVIEW
Plt Morphology: ADEQUATE
WBC Morphology: REACTIVE

## 2020-04-19 NOTE — Progress Notes (Signed)
Patient states she is having bad waves of nausea.

## 2020-04-19 NOTE — Progress Notes (Signed)
Hematology/Oncology Consult note [facility] Telephone:([phone] [phone]) [phone]  Patient Care Team: Tracie Harrier, MD as PCP - General (Internal Medicine)   Name of the patient: Rachel Obrien  [id number]  1955/03/27    Reason for referral-lymphocytosis   Referring physician-Dr. Ginette Pitman  Date of visit: 04/19/20   History of presenting illness-patient is a 65 year old female with a past medical history significant for hypertension, type 2 diabetes who has been referred to [place] for leukocytosis/lymphocytosis.  Looking back at her prior CBCs. Patient had a mildly elevated white count that fluctuates between 10-12 over the last 4 years.  Hemoglobin has remained stable around 14.  Platelet counts have been normal.  Differential is mainly showing lymphocytosis with an absolute lymphocyte count that has remained between 3.8-4.7.  CMP has been within normal limits.  Patient is overall doing well and has a stable appetite.  Has not had any unintentional weight loss.  She reports intermittent hot flashes.  ECOG PS- 1  Pain scale- 0   Review of systems- Review of Systems  Constitutional: Positive for malaise/fatigue. Negative for chills, fever and weight loss.  HENT: Negative for congestion, ear discharge and nosebleeds.   Eyes: Negative for blurred vision.  Respiratory: Negative for cough, hemoptysis, sputum production, shortness of breath and wheezing.   Cardiovascular: Negative for chest pain, palpitations, orthopnea and claudication.  Gastrointestinal: Negative for abdominal pain, blood in stool, constipation, diarrhea, heartburn, melena, nausea and vomiting.  Genitourinary: Negative for dysuria, flank pain, frequency, hematuria and urgency.  Musculoskeletal: Negative for back pain, joint pain and myalgias.  Skin: Negative for rash.  Neurological: Negative for dizziness, tingling, focal weakness, seizures, weakness and headaches.  Endo/Heme/Allergies: Does  not bruise/bleed easily.  Psychiatric/Behavioral: Negative for depression and suicidal ideas. The patient does not have insomnia.     Allergies  Allergen Reactions  . Contrast Media [Iodinated Diagnostic Agents] Hives    Pt reacted to the IV contrast after being premedicated. TKV  . Ace Inhibitors     Other reaction(s): Muscle Pain Elevated Cardiac Enzymes   . Lipitor [Atorvastatin Calcium]   . Penicillins   . Zocor [Simvastatin]     There are no problems to display for this patient.    Past Medical History:  Diagnosis Date  . Atypical nevus 12/31/2017   L lat deltoid - excision 03/09/2018  . DDD (degenerative disc disease), cervical   . DDD (degenerative disc disease), lumbar   . Diabetes mellitus without complication ([place])   . Dysplastic nevus 12/06/2014   L post lat prox thigh - moderate  . GERD (gastroesophageal reflux disease)   . Headache   . Hypertension   . Pleurisy      Past Surgical History:  Procedure Laterality Date  . ARTHROSCOPIC REPAIR ACL Right   . CESAREAN SECTION    . COLONOSCOPY WITH ESOPHAGOGASTRODUODENOSCOPY (EGD)    . EUS N/A 01/14/2018   Procedure: UPPER ENDOSCOPIC ULTRASOUND (EUS) RADIAL;  Surgeon: Jola Schmidt, MD;  Location: [facility] ENDOSCOPY;  Service: Endoscopy;  Laterality: N/A;    Social History   Socioeconomic History  . Marital status: Divorced    Spouse name: Not on file  . Number of children: Not on file  . Years of education: Not on file  . Highest education level: Not on file  Occupational History  . Not on file  Tobacco Use  . Smoking status: Never Smoker  . Smokeless tobacco: Never Used  Vaping Use  . Vaping Use: Never used  Substance  and Sexual Activity  . Alcohol use: Never  . Drug use: Never  . Sexual activity: Not on file  Other Topics Concern  . Not on file  Social History Narrative  . Not on file   Social Determinants of Health   Financial Resource Strain: Not on file  Food Insecurity: Not on file   Transportation Needs: Not on file  Physical Activity: Not on file  Stress: Not on file  Social Connections: Not on file  Intimate Partner Violence: Not on file     Family History  Problem Relation Age of Onset  . Breast cancer Mother 48  . Breast cancer Maternal Aunt 72  . Breast cancer Maternal Grandmother 23  . Breast cancer Maternal Aunt 69  . Breast cancer Maternal Aunt 60  . Breast cancer Maternal Aunt 60  . Bladder Cancer Father   . Lung cancer Father   . Rheum arthritis Father   . Heart disease Father   . Hypertension Father      Current Outpatient Medications:  .  ACCU-CHEK GUIDE test strip, USE 1 EACH (1 STRIP TOTAL) 3 (THREE) TIMES DAILY USE AS INSTRUCTED., Disp: , Rfl:  .  aspirin 81 MG chewable tablet, Chew 81 mg by mouth daily., Disp: , Rfl:  .  Dulaglutide 1.5 MG/0.5ML SOPN, Inject into the skin., Disp: , Rfl:  .  DULoxetine (CYMBALTA) 20 MG capsule, Take by mouth., Disp: , Rfl:  .  fenofibrate (TRICOR) 48 MG tablet, Take 48 mg by mouth daily., Disp: , Rfl:  .  fexofenadine (ALLEGRA) 180 MG tablet, Take 180 mg by mouth daily., Disp: , Rfl:  .  glyBURIDE (DIABETA) 5 MG tablet, Take 10 mg by mouth daily with breakfast., Disp: , Rfl:  .  Insulin Glargine (BASAGLAR KWIKPEN) 100 UNIT/ML, INJECT 10 UNITS SUBCUTANEOUSLY EVERY EVENING, Disp: , Rfl:  .  Insulin Pen Needle (B-D ULTRAFINE III SHORT PEN) 31G X 8 MM MISC, See admin instructions., Disp: , Rfl:  .  losartan (COZAAR) 50 MG tablet, Take 50 mg by mouth daily., Disp: , Rfl:  .  metFORMIN (GLUCOPHAGE) 1000 MG tablet, Take 1,000 mg by mouth 2 (two) times daily with a meal., Disp: , Rfl:  .  pantoprazole (PROTONIX) 40 MG tablet, Take 40 mg by mouth daily., Disp: , Rfl:  .  triamterene-hydrochlorothiazide (DYAZIDE) 37.5-25 MG capsule, Take 1 capsule by mouth daily., Disp: , Rfl:  .  metroNIDAZOLE (METROGEL) 1 % gel, Apply topically daily. (Patient not taking: Reported on 04/19/2020), Disp: 45 g, Rfl: 11 .  sucralfate  (CARAFATE) 1 g tablet, Take 1 g by mouth 4 (four) times daily -  with meals and at bedtime. (Patient not taking: Reported on 04/19/2020), Disp: , Rfl:    Physical exam:  Vitals:   04/19/20 1116  BP: 125/86  Pulse: 90  Resp: 20  Temp: (!) 96.4 F (35.8 C)  SpO2: 98%  Weight: 210 lb 1.6 oz (95.3 kg)   Physical Exam Constitutional:      General: She is not in acute distress. Eyes:     Extraocular Movements: EOM normal.  Cardiovascular:     Rate and Rhythm: Normal rate and regular rhythm.     Heart sounds: Normal heart sounds.  Pulmonary:     Effort: Pulmonary effort is normal.     Breath sounds: Normal breath sounds.  Abdominal:     General: Bowel sounds are normal.     Palpations: Abdomen is soft.  Lymphadenopathy:  Comments: No palpable cervical, supraclavicular, axillary or inguinal adenopathy   Skin:    General: Skin is warm and dry.  Neurological:     Mental Status: She is alert and oriented to person, place, and time.        CMP Latest Ref Rng & Units 02/19/2014  Glucose 65 - 99 mg/dL 196(H)  BUN 7 - 18 mg/dL 20(H)  Creatinine 0.60 - 1.30 mg/dL 1.05  Sodium 136 - 145 mmol/L 138  Potassium 3.5 - 5.1 mmol/L 3.8  Chloride 98 - 107 mmol/L 104  CO2 21 - 32 mmol/L 25  Calcium 8.5 - 10.1 mg/dL 8.9   CBC Latest Ref Rng & Units 04/19/2020  WBC 4.0 - 10.5 K/uL 11.2(H)  Hemoglobin 12.0 - 15.0 g/dL 14.4  Hematocrit 36.0 - 46.0 % 41.4  Platelets 150 - 400 K/uL 339    Assessment and plan- Patient is a 65 y.o. female referred for leukocytosis/lymphocytosis.  Discussed with patient that lymphocytosis can be reactive versus clonal.  She has had mildly elevated white cell count between 10-13 over the last 4 years with mild absolute lymphocytosis.  Today I will check a CBC with differential, smear review and peripheral flow cytometry.  Discussed that monoclonal B-cell lymphocytosis can be seen in conditions like CLL.  That most cases of CLL can be managed with conservative  management without the need for treatment.  Treatment for CLL is only considered when there are significant B symptoms cytopenias or splenomegaly a rapidly increasing white cell count.  I will see the patient back in 1 week's time for a video visit to discuss the results of her blood work.   Thank you for this kind referral and the opportunity to participate in the care of this patient   Visit Diagnosis 1. Lymphocytosis     Dr. Randa Evens, MD, MPH [facility] [phone] 04/19/2020 4:02 PM

## 2020-04-23 LAB — COMP PANEL: LEUKEMIA/LYMPHOMA

## 2020-04-26 ENCOUNTER — Inpatient Hospital Stay (HOSPITAL_BASED_OUTPATIENT_CLINIC_OR_DEPARTMENT_OTHER): Payer: 59 | Admitting: Oncology

## 2020-04-26 DIAGNOSIS — D72820 Lymphocytosis (symptomatic): Secondary | ICD-10-CM | POA: Diagnosis not present

## 2020-04-26 NOTE — Progress Notes (Signed)
I connected with Rachel Obrien on 04/26/20 at  2:15 PM EST by video enabled telemedicine visit and verified that I am speaking with the correct person using two identifiers.   I discussed the limitations, risks, security and privacy concerns of performing an evaluation and management service by telemedicine and the availability of in-person appointments. I also discussed with the patient that there may be a patient responsible charge related to this service. The patient expressed understanding and agreed to proceed.  Other persons participating in the visit and their role in the encounter:  none  Patient's location:  home Provider's location:  work  Risk analyst Complaint: Discuss results of blood work  History of present illness: patient is a 65 year old female with a past medical history significant for hypertension, type 2 diabetes who has been referred to [place] for leukocytosis/lymphocytosis.  Looking back at her prior CBCs. Patient had a mildly elevated white count that fluctuates between 10-12 over the last 4 years.  Hemoglobin has remained stable around 14.  Platelet counts have been normal.  Differential is mainly showing lymphocytosis with an absolute lymphocyte count that has remained between 3.8-4.7.  CMP has been within normal limits.  Patient is overall doing well and has a stable appetite.  Has not had any unintentional weight loss.  She reports intermittent hot flashes  Results of blood work from 04/19/2020 showed a mildly elevated white cell count of 11.2, H&H of 14.4/41.4 with a platelet count of 349.  Differential was normal with no evidence of lymphocytosis.  Smear review was unremarkable with occasional reactive lymphs.  Low cytometry did not show any significant immunophenotypic abnormality  Interval history no acute issues since last visit   Review of Systems  Constitutional: Negative for chills, fever, malaise/fatigue and weight loss.  HENT: Negative for congestion, ear discharge and  nosebleeds.   Eyes: Negative for blurred vision.  Respiratory: Negative for cough, hemoptysis, sputum production, shortness of breath and wheezing.   Cardiovascular: Negative for chest pain, palpitations, orthopnea and claudication.  Gastrointestinal: Negative for abdominal pain, blood in stool, constipation, diarrhea, heartburn, melena, nausea and vomiting.  Genitourinary: Negative for dysuria, flank pain, frequency, hematuria and urgency.  Musculoskeletal: Negative for back pain, joint pain and myalgias.  Skin: Negative for rash.  Neurological: Negative for dizziness, tingling, focal weakness, seizures, weakness and headaches.  Endo/Heme/Allergies: Does not bruise/bleed easily.  Psychiatric/Behavioral: Negative for depression and suicidal ideas. The patient does not have insomnia.     Allergies  Allergen Reactions  . Contrast Media [Iodinated Diagnostic Agents] Hives    Pt reacted to the IV contrast after being premedicated. TKV  . Ace Inhibitors     Other reaction(s): Muscle Pain Elevated Cardiac Enzymes   . Lipitor [Atorvastatin Calcium]   . Niacin Other (See Comments)    Other reaction(s): Other (See Comments) flushing flushing   . Other Other (See Comments)    walnuts walnuts   . Penicillins   . Statins     Other reaction(s): Muscle Pain  . Zocor [Simvastatin]     Past Medical History:  Diagnosis Date  . Atypical nevus 12/31/2017   L lat deltoid - excision 03/09/2018  . DDD (degenerative disc disease), cervical   . DDD (degenerative disc disease), lumbar   . Diabetes mellitus without complication ([place])   . Dysplastic nevus 12/06/2014   L post lat prox thigh - moderate  . GERD (gastroesophageal reflux disease)   . Headache   . Hypertension   . Pleurisy  Past Surgical History:  Procedure Laterality Date  . ARTHROSCOPIC REPAIR ACL Right   . CESAREAN SECTION    . COLONOSCOPY WITH ESOPHAGOGASTRODUODENOSCOPY (EGD)    . EUS N/A 01/14/2018   Procedure: UPPER  ENDOSCOPIC ULTRASOUND (EUS) RADIAL;  Surgeon: Jola Schmidt, MD;  Location: [facility] ENDOSCOPY;  Service: Endoscopy;  Laterality: N/A;    Social History   Socioeconomic History  . Marital status: Divorced    Spouse name: Not on file  . Number of children: Not on file  . Years of education: Not on file  . Highest education level: Not on file  Occupational History  . Not on file  Tobacco Use  . Smoking status: Never Smoker  . Smokeless tobacco: Never Used  Vaping Use  . Vaping Use: Never used  Substance and Sexual Activity  . Alcohol use: Never  . Drug use: Never  . Sexual activity: Not on file  Other Topics Concern  . Not on file  Social History Narrative  . Not on file   Social Determinants of Health   Financial Resource Strain: Not on file  Food Insecurity: Not on file  Transportation Needs: Not on file  Physical Activity: Not on file  Stress: Not on file  Social Connections: Not on file  Intimate Partner Violence: Not on file    Family History  Problem Relation Age of Onset  . Breast cancer Mother 66  . Breast cancer Maternal Aunt 24  . Breast cancer Maternal Grandmother 52  . Breast cancer Maternal Aunt 60  . Breast cancer Maternal Aunt 60  . Breast cancer Maternal Aunt 60  . Bladder Cancer Father   . Lung cancer Father   . Rheum arthritis Father   . Heart disease Father   . Hypertension Father      Current Outpatient Medications:  .  aspirin 81 MG chewable tablet, Chew 81 mg by mouth daily., Disp: , Rfl:  .  Dulaglutide 1.5 MG/0.5ML SOPN, Inject into the skin., Disp: , Rfl:  .  DULoxetine (CYMBALTA) 20 MG capsule, Take by mouth., Disp: , Rfl:  .  fenofibrate (TRICOR) 48 MG tablet, Take 48 mg by mouth daily., Disp: , Rfl:  .  fexofenadine (ALLEGRA) 180 MG tablet, Take 180 mg by mouth daily., Disp: , Rfl:  .  glyBURIDE (DIABETA) 5 MG tablet, Take 10 mg by mouth daily with breakfast., Disp: , Rfl:  .  Insulin Glargine (BASAGLAR KWIKPEN) 100 UNIT/ML, INJECT 10  UNITS SUBCUTANEOUSLY EVERY EVENING, Disp: , Rfl:  .  Insulin Pen Needle (B-D ULTRAFINE III SHORT PEN) 31G X 8 MM MISC, See admin instructions., Disp: , Rfl:  .  losartan (COZAAR) 50 MG tablet, Take 50 mg by mouth daily., Disp: , Rfl:  .  metFORMIN (GLUCOPHAGE) 1000 MG tablet, Take 1,000 mg by mouth 2 (two) times daily with a meal., Disp: , Rfl:  .  pantoprazole (PROTONIX) 40 MG tablet, Take 40 mg by mouth daily., Disp: , Rfl:  .  triamterene-hydrochlorothiazide (DYAZIDE) 37.5-25 MG capsule, Take 1 capsule by mouth daily., Disp: , Rfl:  .  ACCU-CHEK GUIDE test strip, USE 1 EACH (1 STRIP TOTAL) 3 (THREE) TIMES DAILY USE AS INSTRUCTED., Disp: , Rfl:   No results found.  No images are attached to the encounter.   CMP Latest Ref Rng & Units 02/19/2014  Glucose 65 - 99 mg/dL 196(H)  BUN 7 - 18 mg/dL 20(H)  Creatinine 0.60 - 1.30 mg/dL 1.05  Sodium 136 - 145 mmol/L 138  Potassium 3.5 - 5.1 mmol/L 3.8  Chloride 98 - 107 mmol/L 104  CO2 21 - 32 mmol/L 25  Calcium 8.5 - 10.1 mg/dL 8.9   CBC Latest Ref Rng & Units 04/19/2020  WBC 4.0 - 10.5 K/uL 11.2(H)  Hemoglobin 12.0 - 15.0 g/dL 14.4  Hematocrit 36.0 - 46.0 % 41.4  Platelets 150 - 400 K/uL 339     Observation/objective: Appears in no acute distress over video visit today.  Breathing is nonlabored  Assessment and plan: Patient is a 65 year old female referred for lymphocytosis likely reactive  She has had a mildly elevated white cell count that fluctuates between 10-13 over the last 5 years.  In some instances patient has had lymphocytosis.  However peripheral flow cytometry did not reveal any immunophenotypic abnormality suggestive of CLL or other myeloproliferative disorder.  Discussed with the patient that patient does not require any follow-up for this with hematology at this time.  No treatment needed for this or dietary modifications.  She can continue to follow-up with her primary care doctor and get her CBC checked every 6  months.  Follow-up instructions: Patient can be referred to [place] in the future if questions or concerns arise  I discussed the assessment and treatment plan with the patient. The patient was provided an opportunity to ask questions and all were answered. The patient agreed with the plan and demonstrated an understanding of the instructions.   The patient was advised to call back or seek an in-person evaluation if the symptoms worsen or if the condition fails to improve as anticipated.   Visit Diagnosis: 1. Lymphocytosis     Dr. Randa Evens, MD, MPH [facility] at [facility] Tel- [phone] 04/26/2020 3:17 PM

## 2020-05-18 IMAGING — CT CT HEART SCORING
2 series · 16 of 20 positions shown, 18 images · non-contrast
Comparison: None.
COMPARISON: None.

Addendum:
EXAM:
OVER-READ INTERPRETATION  CT CHEST

The following report is an over-read performed by radiologist Dr.
Kaylee Lum [REDACTED] on 05/12/2018. This
over-read does not include interpretation of cardiac or coronary
anatomy or pathology. The coronary calcium score interpretation by
the cardiologist is attached.
CLINICAL DATA: Risk stratification
Coronary Calcium Score
TECHNIQUE: The patient was scanned on a Siemens Somatom 64 slice scanner. Axial
non-contrast 3 mm slices were carried out through the heart. The
data set was analyzed on a dedicated work station and scored using
the Agatson method.

[Series 3: casc 3.0 i36f 2 bestdiast 73 % · axial · 0.36mm/px · z∈[-174,-90]mm · 8 of 37 slices shown, 10 images]
[im 5/37  vessel]
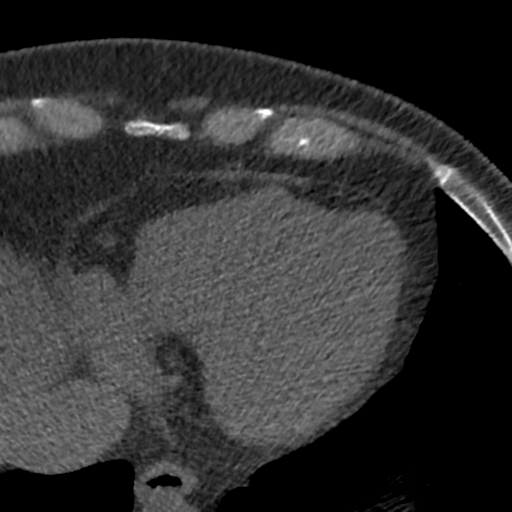
[im 5/37  lung]
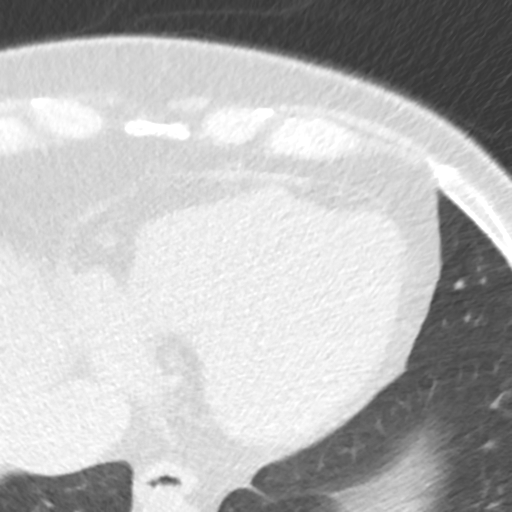
[im 9/37  vessel]
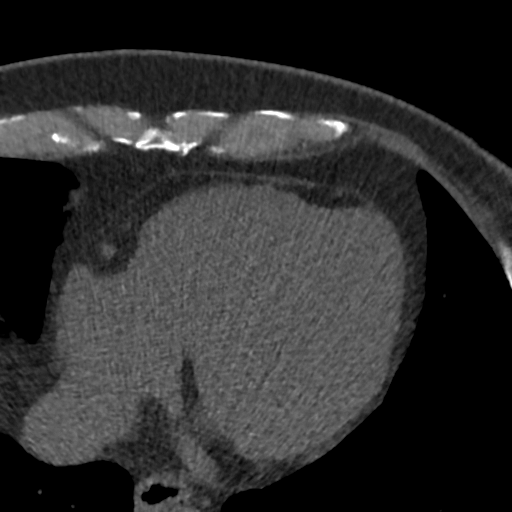
[im 13/37  vessel]
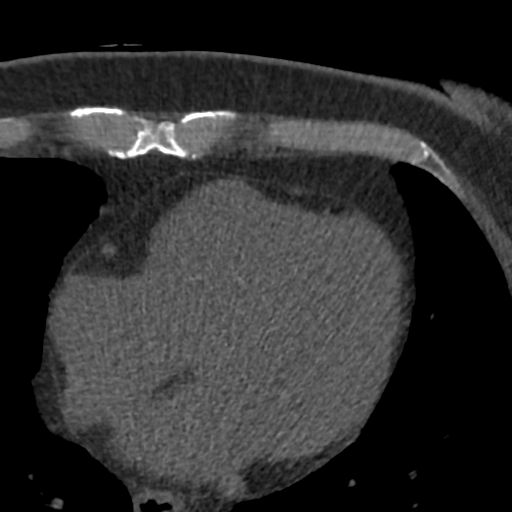
[im 17/37  vessel]
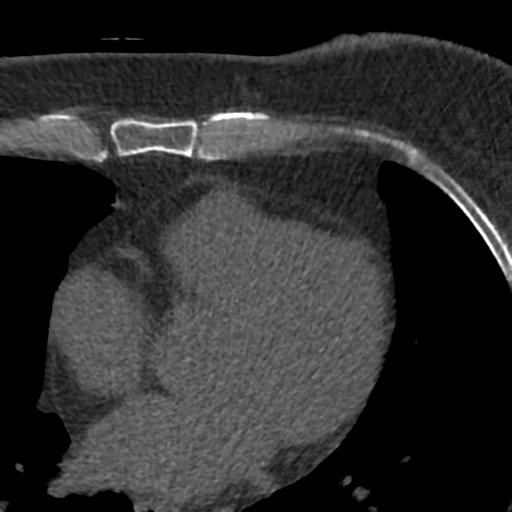
[im 21/37  vessel]
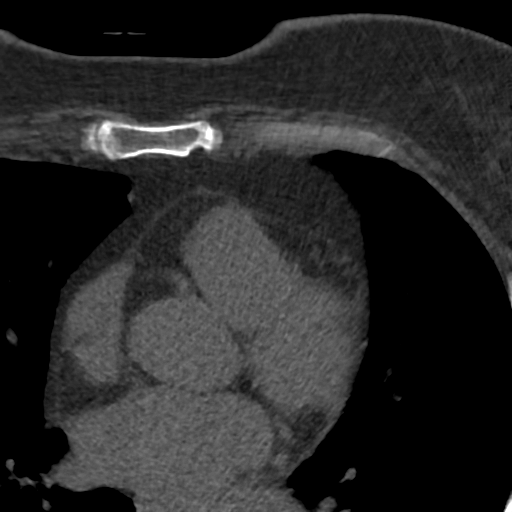
[im 21/37  lung]
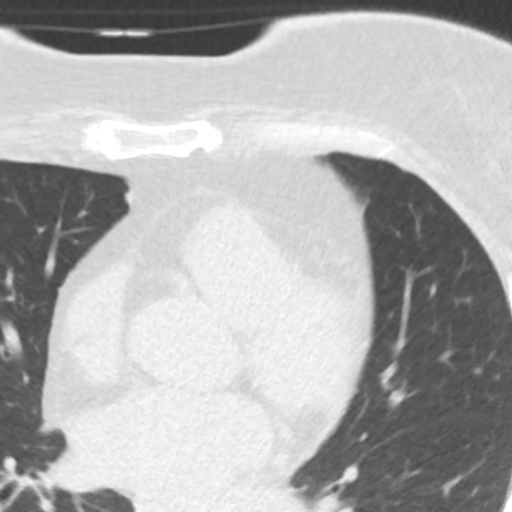
[im 25/37  vessel]
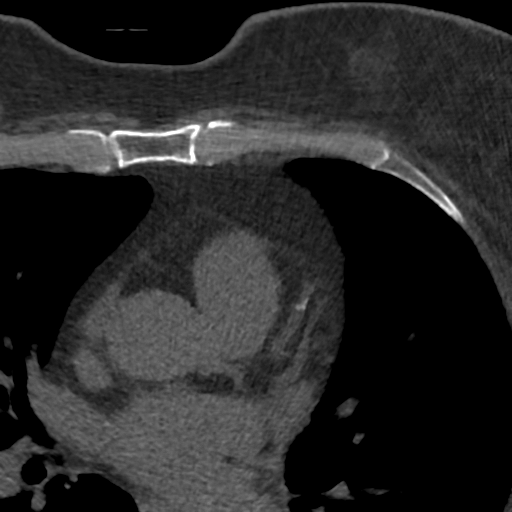
[im 29/37  vessel]
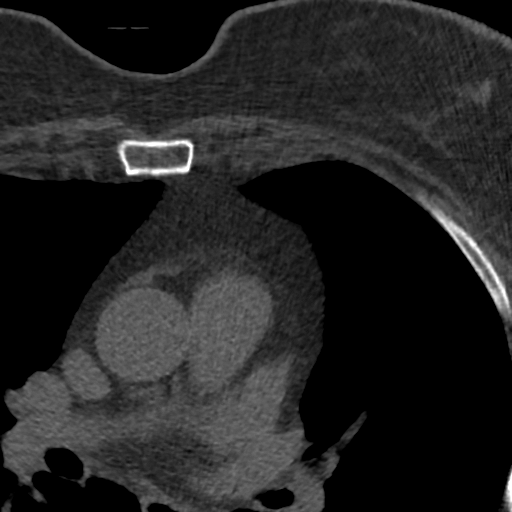
[im 33/37  vessel]
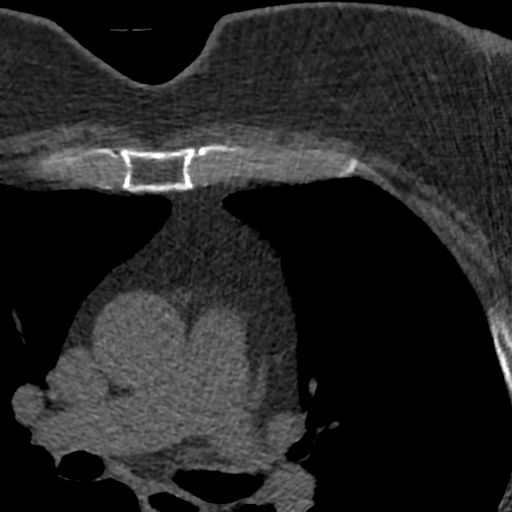

[Series 5: lung st 72 % · axial · 0.62mm/px · z∈[-174,-90]mm · 8 of 37 slices shown]
[im 5/37  lung]
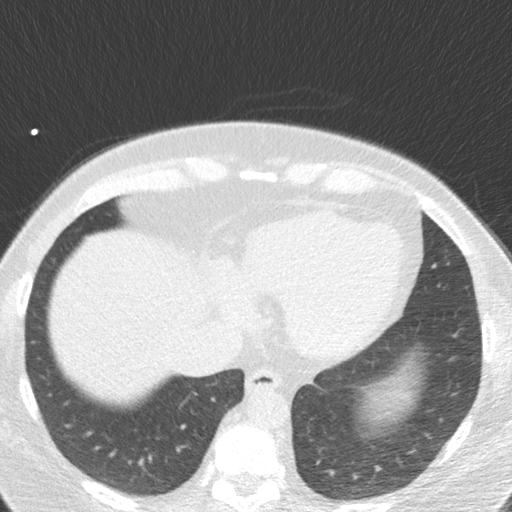
[im 9/37  lung]
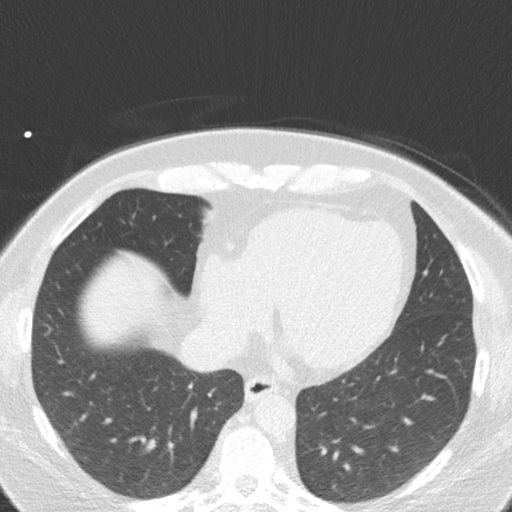
[im 13/37  lung]
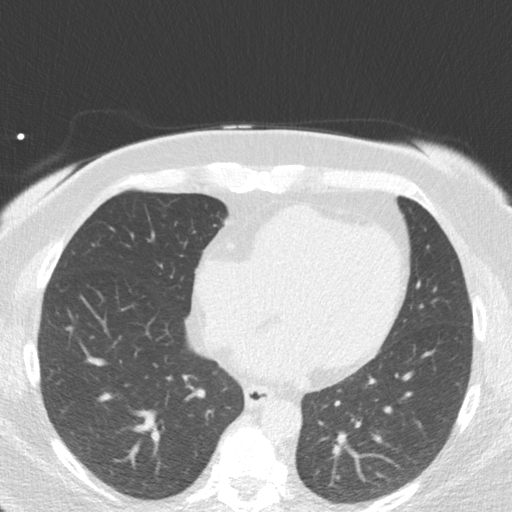
[im 17/37  lung]
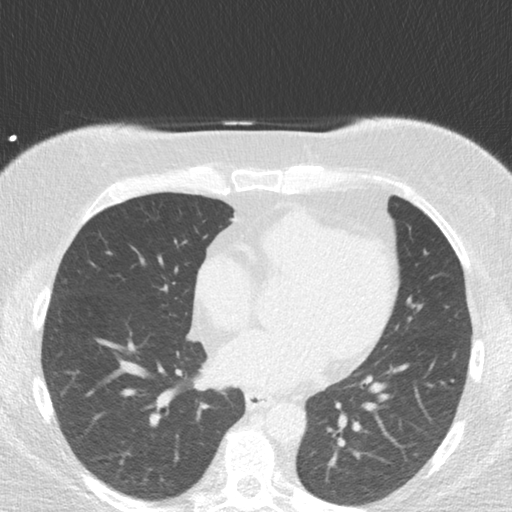
[im 21/37  lung]
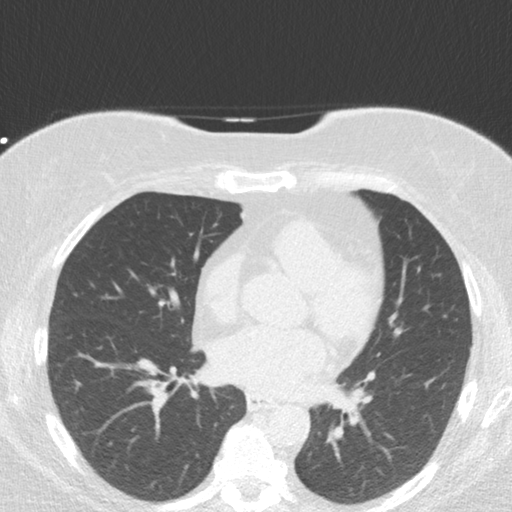
[im 25/37  lung]
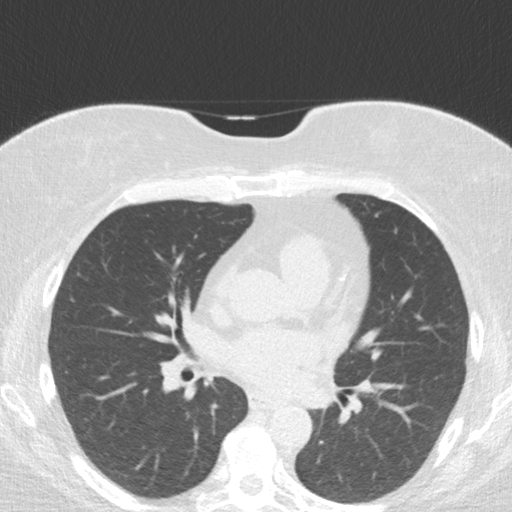
[im 29/37  lung]
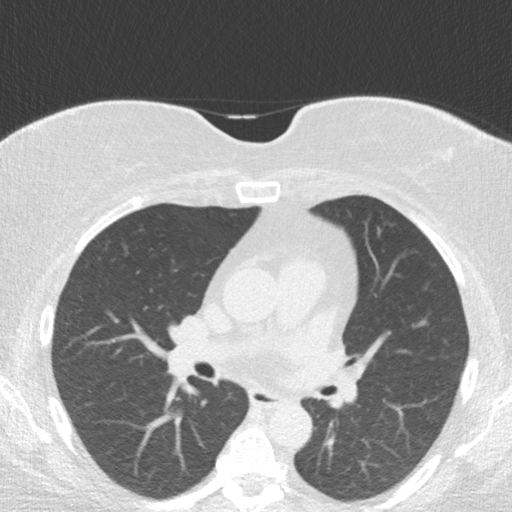
[im 33/37  lung]
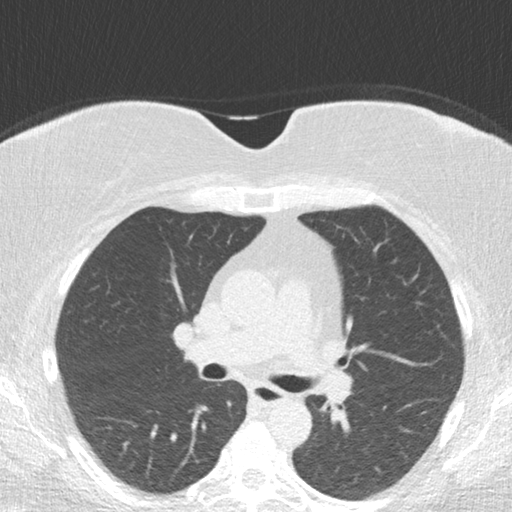

[16 of 20 positions shown; findings below may reference images not displayed]

FINDINGS: Aortic atherosclerosis. Within the visualized portions of the thorax
there are no suspicious appearing pulmonary nodules or masses, there
is no acute consolidative airspace disease, no pleural effusions, no
pneumothorax and no lymphadenopathy. Visualized portions of the
upper abdomen demonstrates mild diffuse low attenuation throughout
the visualized hepatic parenchyma. There are no aggressive appearing
lytic or blastic lesions noted in the visualized portions of the
skeleton.
IMPRESSION: 1.  Aortic Atherosclerosis (ENFD2-G1X.X).
2. Hepatic steatosis.
FINDINGS: Non-cardiac: See separate report from [REDACTED].

Ascending aorta: Normal diameter 3.3 cm

Pericardium: Normal

Coronary arteries: Calcium noted in mid LAD and distal RCA
IMPRESSION: Coronary calcium score of 20. This was 76 th percentile for age and
sex matched control.

Eventon Ubisi

*** End of Addendum ***
EXAM:
OVER-READ INTERPRETATION  CT CHEST

The following report is an over-read performed by radiologist Dr.
Kaylee Lum [REDACTED] on 05/12/2018. This
over-read does not include interpretation of cardiac or coronary
anatomy or pathology. The coronary calcium score interpretation by
the cardiologist is attached.
FINDINGS: Aortic atherosclerosis. Within the visualized portions of the thorax
there are no suspicious appearing pulmonary nodules or masses, there
is no acute consolidative airspace disease, no pleural effusions, no
pneumothorax and no lymphadenopathy. Visualized portions of the
upper abdomen demonstrates mild diffuse low attenuation throughout
the visualized hepatic parenchyma. There are no aggressive appearing
lytic or blastic lesions noted in the visualized portions of the
skeleton.
IMPRESSION: 1.  Aortic Atherosclerosis (ENFD2-G1X.X).
2. Hepatic steatosis.

## 2021-01-21 ENCOUNTER — Other Ambulatory Visit: Payer: Self-pay | Admitting: Internal Medicine

## 2021-01-21 DIAGNOSIS — Z1231 Encounter for screening mammogram for malignant neoplasm of breast: Secondary | ICD-10-CM

## 2021-02-27 ENCOUNTER — Other Ambulatory Visit: Payer: Self-pay

## 2021-02-27 ENCOUNTER — Ambulatory Visit
Admission: RE | Admit: 2021-02-27 | Discharge: 2021-02-27 | Disposition: A | Discharge status: Home / Self Care | Payer: Managed Care, Other (non HMO) | Source: Ambulatory Visit | Attending: Internal Medicine | Admitting: Internal Medicine

## 2021-02-27 DIAGNOSIS — Z1231 Encounter for screening mammogram for malignant neoplasm of breast: Secondary | ICD-10-CM | POA: Diagnosis not present

## 2021-04-11 ENCOUNTER — Other Ambulatory Visit: Payer: Self-pay

## 2021-04-11 ENCOUNTER — Ambulatory Visit: Payer: Managed Care, Other (non HMO) | Admitting: Dermatology

## 2021-04-11 DIAGNOSIS — L820 Inflamed seborrheic keratosis: Secondary | ICD-10-CM

## 2021-04-11 DIAGNOSIS — D1800 Hemangioma unspecified site: Secondary | ICD-10-CM

## 2021-04-11 DIAGNOSIS — Z1283 Encounter for screening for malignant neoplasm of skin: Secondary | ICD-10-CM

## 2021-04-11 DIAGNOSIS — L578 Other skin changes due to chronic exposure to nonionizing radiation: Secondary | ICD-10-CM | POA: Diagnosis not present

## 2021-04-11 DIAGNOSIS — D229 Melanocytic nevi, unspecified: Secondary | ICD-10-CM

## 2021-04-11 DIAGNOSIS — Z86018 Personal history of other benign neoplasm: Secondary | ICD-10-CM

## 2021-04-11 DIAGNOSIS — L814 Other melanin hyperpigmentation: Secondary | ICD-10-CM

## 2021-04-11 DIAGNOSIS — L821 Other seborrheic keratosis: Secondary | ICD-10-CM

## 2021-04-11 NOTE — Progress Notes (Signed)
Follow-Up Visit   Subjective  Rachel Obrien is a 66 y.o. female who presents for the following: Follow-up (Patient here today for 1 year tbse. She reports a brown spot at right lower abdomen at c-section scar that looks to be growing. ). The patient presents for Total-Body Skin Exam (TBSE) for skin cancer screening and mole check.  The patient has spots, moles and lesions to be evaluated, some may be new or changing and the patient has concerns that these could be cancer.  The following portions of the chart were reviewed this encounter and updated as appropriate:  Tobacco   Allergies   Meds   Problems   Med Hx   Surg Hx   Fam Hx      Review of Systems: No other skin or systemic complaints except as noted in HPI or Assessment and Plan.  Objective  Well appearing patient in no apparent distress; mood and affect are within normal limits.  A full examination was performed including scalp, head, eyes, ears, nose, lips, neck, chest, axillae, abdomen, back, buttocks, bilateral upper extremities, bilateral lower extremities, hands, feet, fingers, toes, fingernails, and toenails. All findings within normal limits unless otherwise noted below.  right lower abdomen x 1 Erythematous stuck-on, waxy papule or plaque   Assessment & Plan  Inflamed seborrheic keratosis right lower abdomen x 1 Irritated  Destruction of lesion - right lower abdomen x 1 Complexity: simple   Destruction method: cryotherapy   Informed consent: discussed and consent obtained   Timeout:  patient name, date of birth, surgical site, and procedure verified Lesion destroyed using liquid nitrogen: Yes   Region frozen until ice ball extended beyond lesion: Yes   Outcome: patient tolerated procedure well with no complications   Post-procedure details: wound care instructions given   Additional details:  Prior to procedure, discussed risks of blister formation, small wound, skin dyspigmentation, or rare scar following  cryotherapy. Recommend Vaseline ointment to treated areas while healing.  Lentigines - Scattered tan macules - Due to sun exposure - Benign-appearing, observe - Recommend daily broad spectrum sunscreen SPF 30+ to sun-exposed areas, reapply every 2 hours as needed. - Call for any changes  Seborrheic Keratoses - Stuck-on, waxy, tan-brown papules and/or plaques  - Benign-appearing - Discussed benign etiology and prognosis. - Observe - Call for any changes  Melanocytic Nevi - Tan-brown and/or pink-flesh-colored symmetric macules and papules - Benign appearing on exam today - Observation - Call clinic for new or changing moles - Recommend daily use of broad spectrum spf 30+ sunscreen to sun-exposed areas.   Hemangiomas - Red papules - Discussed benign nature - Observe - Call for any changes  Actinic Damage - Chronic condition, secondary to cumulative UV/sun exposure - diffuse scaly erythematous macules with underlying dyspigmentation - Recommend daily broad spectrum sunscreen SPF 30+ to sun-exposed areas, reapply every 2 hours as needed.  - Staying in the shade or wearing long sleeves, sun glasses (UVA+UVB protection) and wide brim hats (4-inch brim around the entire circumference of the hat) are also recommended for sun protection.  - Call for new or changing lesions.  History of Dysplastic Nevi - No evidence of recurrence today see patient history  - Recommend regular full body skin exams - Recommend daily broad spectrum sunscreen SPF 30+ to sun-exposed areas, reapply every 2 hours as needed.  - Call if any new or changing lesions are noted between office visits  Skin cancer screening performed today.  Return for 1 year tbse .  IRuthell Rummage, CMA, am acting as scribe for Sarina Ser, MD. Documentation: I have reviewed the above documentation for accuracy and completeness, and I agree with the above.  Sarina Ser, MD

## 2021-04-11 NOTE — Patient Instructions (Addendum)
Seborrheic Keratosis  What causes seborrheic keratoses? Seborrheic keratoses are harmless, common skin growths that first appear during adult life.  As time goes by, more growths appear.  Some people may develop a large number of them.  Seborrheic keratoses appear on both covered and uncovered body parts.  They are not caused by sunlight.  The tendency to develop seborrheic keratoses can be inherited.  They vary in color from skin-colored to gray, brown, or even black.  They can be either smooth or have a rough, warty surface.   Seborrheic keratoses are superficial and look as if they were stuck on the skin.  Under the microscope this type of keratosis looks like layers upon layers of skin.  That is why at times the top layer may seem to fall off, but the rest of the growth remains and re-grows.    Treatment Seborrheic keratoses do not need to be treated, but can easily be removed in the office.  Seborrheic keratoses often cause symptoms when they rub on clothing or jewelry.  Lesions can be in the way of shaving.  If they become inflamed, they can cause itching, soreness, or burning.  Removal of a seborrheic keratosis can be accomplished by freezing, burning, or surgery. If any spot bleeds, scabs, or grows rapidly, please return to have it checked, as these can be an indication of a skin cancer.  Cryotherapy Aftercare  Wash gently with soap and water everyday.   Apply Vaseline and Band-Aid daily until healed.         Melanoma ABCDEs  Melanoma is the most dangerous type of skin cancer, and is the leading cause of death from skin disease.  You are more likely to develop melanoma if you: Have light-colored skin, light-colored eyes, or red or blond hair Spend a lot of time in the sun Tan regularly, either outdoors or in a tanning bed Have had blistering sunburns, especially during childhood Have a close family member who has had a melanoma Have atypical moles or large birthmarks  Early  detection of melanoma is key since treatment is typically straightforward and cure rates are extremely high if we catch it early.   The first sign of melanoma is often a change in a mole or a new dark spot.  The ABCDE system is a way of remembering the signs of melanoma.  A for asymmetry:  The two halves do not match. B for border:  The edges of the growth are irregular. C for color:  A mixture of colors are present instead of an even brown color. D for diameter:  Melanomas are usually (but not always) greater than 34mm - the size of a pencil eraser. E for evolution:  The spot keeps changing in size, shape, and color.  Please check your skin once per month between visits. You can use a small mirror in front and a large mirror behind you to keep an eye on the back side or your body.   If you see any new or changing lesions before your next follow-up, please call to schedule a visit.  Please continue daily skin protection including broad spectrum sunscreen SPF 30+ to sun-exposed areas, reapplying every 2 hours as needed when you're outdoors.   Staying in the shade or wearing long sleeves, sun glasses (UVA+UVB protection) and wide brim hats (4-inch brim around the entire circumference of the hat) are also recommended for sun protection.    If You Need Anything After Your Visit  If you have  any questions or concerns for your doctor, please call our main line at [phone] and press option 4 to reach your doctor's medical assistant. If no one answers, please leave a voicemail as directed and we will return your call as soon as possible. Messages left after 4 pm will be answered the following business day.   You may also send [place] a message via [place]. We typically respond to MyChart messages within 1-2 business days.  For prescription refills, please ask your pharmacy to contact our office. Our fax number is [phone].  If you have an urgent issue when the clinic is closed that cannot wait  until the next business day, you can page your doctor at the number below.    Please note that while we do our best to be available for urgent issues outside of office hours, we are not available 24/7.   If you have an urgent issue and are unable to reach [place], you may choose to seek medical care at your doctor's office, retail clinic, urgent care center, or emergency room.  If you have a medical emergency, please immediately call 911 or go to the emergency department.  Pager Numbers  - Dr. Nehemiah Massed: [phone]  - Dr. Laurence Ferrari: [phone]  - Dr. Nicole Kindred: [phone]  In the event of inclement weather, please call our main line at [phone] for an update on the status of any delays or closures.  Dermatology Medication Tips: Please keep the boxes that topical medications come in in order to help keep track of the instructions about where and how to use these. Pharmacies typically print the medication instructions only on the boxes and not directly on the medication tubes.   If your medication is too expensive, please contact our office at [phone] option 4 or send [place] a message through [place].   We are unable to tell what your co-pay for medications will be in advance as this is different depending on your insurance coverage. However, we may be able to find a substitute medication at lower cost or fill out paperwork to get insurance to cover a needed medication.   If a prior authorization is required to get your medication covered by your insurance company, please allow [place] 1-2 business days to complete this process.  Drug prices often vary depending on where the prescription is filled and some pharmacies may offer cheaper prices.  The website www.goodrx.com contains coupons for medications through different pharmacies. The prices here do not account for what the cost may be with help from insurance (it may be cheaper with your insurance), but the website can give you the price if you  did not use any insurance.  - You can print the associated coupon and take it with your prescription to the pharmacy.  - You may also stop by our office during regular business hours and pick up a GoodRx coupon card.  - If you need your prescription sent electronically to a different pharmacy, notify our office through [facility] or by phone at [phone] option 4.     Si Usted Necesita Algo Despus de Su Visita  Tambin puede enviarnos un mensaje a travs de Pharmacist, community. Por lo general respondemos a los mensajes de MyChart en el transcurso de 1 a 2 das hbiles.  Para renovar recetas, por favor pida a su farmacia que se ponga en contacto con nuestra oficina. Harland Dingwall de fax es [place] [phone].  Si tiene un asunto urgente cuando la clnica est cerrada y que  no puede esperar hasta el siguiente da hbil, puede llamar/localizar a su doctor(a) al nmero que aparece a continuacin.   Por favor, tenga en cuenta que aunque hacemos todo lo posible para estar disponibles para asuntos urgentes fuera del horario de [place], no estamos disponibles las 24 horas del da, los 7 das de la [place].   Si tiene un problema urgente y no puede comunicarse con nosotros, puede optar por buscar atencin mdica  en el consultorio de su doctor(a), en una clnica privada, en un centro de atencin urgente o en una sala de emergencias.  Si tiene Engineering geologist, por favor llame inmediatamente al 911 o vaya a la sala de emergencias.  Nmeros de bper  - Dr. Nehemiah Massed: [phone]  - Dra. Moye: [phone]  - Dra. Nicole Kindred: [phone]  En caso de inclemencias del [place], por favor llame a Johnsie Kindred principal al [phone] para una actualizacin sobre el [place] de cualquier retraso o cierre.  Consejos para la medicacin en dermatologa: Por favor, guarde las cajas en las que vienen los medicamentos de uso tpico para ayudarle a seguir las instrucciones sobre dnde y cmo usarlos. Las  farmacias generalmente imprimen las instrucciones del medicamento slo en las cajas y no directamente en los tubos del [place].   Si su medicamento es muy caro, por favor, pngase en contacto con Zigmund Overby llamando al [phone] y presione la opcin 4 o envenos un mensaje a travs de Pharmacist, community.   No podemos decirle cul ser su copago por los medicamentos por adelantado ya que esto es diferente dependiendo de la cobertura de su seguro. Sin embargo, es posible que podamos encontrar un medicamento sustituto a Electrical engineer un formulario para que el seguro cubra el medicamento que se considera necesario.   Si se requiere una autorizacin previa para que su compaa de seguros [place] su medicamento, por favor permtanos de 1 a 2 das hbiles para completar este proceso.  Los precios de los medicamentos varan con frecuencia dependiendo del Environmental consultant de dnde se surte la receta y alguna farmacias pueden ofrecer precios ms baratos.  El sitio web www.goodrx.com tiene cupones para medicamentos de Airline pilot. Los precios aqu no tienen en cuenta lo que podra costar con la ayuda del seguro (puede ser ms barato con su seguro), pero el sitio web puede darle el precio si no utiliz Research scientist (physical sciences).  - Puede imprimir el cupn correspondiente y llevarlo con su receta a la farmacia.  - Tambin puede pasar por nuestra oficina durante el horario de atencin regular y Charity fundraiser una tarjeta de cupones de GoodRx.  - Si necesita que su receta se enve electrnicamente a una farmacia diferente, informe a nuestra oficina a travs de MyChart de [place] o por telfono llamando al [phone] y presione la opcin 4.

## 2021-04-16 ENCOUNTER — Encounter: Payer: Self-pay | Admitting: Dermatology

## 2021-05-10 ENCOUNTER — Encounter: Payer: Self-pay | Admitting: Internal Medicine

## 2021-08-19 ENCOUNTER — Other Ambulatory Visit: Payer: Self-pay | Admitting: Gastroenterology

## 2021-08-19 DIAGNOSIS — R1314 Dysphagia, pharyngoesophageal phase: Secondary | ICD-10-CM

## 2021-08-21 ENCOUNTER — Ambulatory Visit
Admission: RE | Admit: 2021-08-21 | Discharge: 2021-08-21 | Disposition: A | Discharge status: Home / Self Care | Payer: BC Managed Care – PPO | Source: Ambulatory Visit | Attending: Gastroenterology | Admitting: Gastroenterology

## 2021-08-21 DIAGNOSIS — R1314 Dysphagia, pharyngoesophageal phase: Secondary | ICD-10-CM | POA: Insufficient documentation

## 2021-09-19 ENCOUNTER — Other Ambulatory Visit: Payer: Self-pay | Admitting: Otolaryngology

## 2021-09-19 DIAGNOSIS — E041 Nontoxic single thyroid nodule: Secondary | ICD-10-CM

## 2021-09-24 ENCOUNTER — Ambulatory Visit
Admission: RE | Admit: 2021-09-24 | Discharge: 2021-09-24 | Disposition: A | Discharge status: Home / Self Care | Payer: BC Managed Care – PPO | Source: Ambulatory Visit | Attending: Otolaryngology | Admitting: Otolaryngology

## 2021-09-24 DIAGNOSIS — E041 Nontoxic single thyroid nodule: Secondary | ICD-10-CM

## 2021-10-01 ENCOUNTER — Other Ambulatory Visit: Payer: Self-pay | Admitting: Otolaryngology

## 2021-10-01 DIAGNOSIS — E041 Nontoxic single thyroid nodule: Secondary | ICD-10-CM

## 2021-10-09 ENCOUNTER — Ambulatory Visit
Admission: RE | Admit: 2021-10-09 | Discharge: 2021-10-09 | Disposition: A | Discharge status: Home / Self Care | Payer: BC Managed Care – PPO | Source: Ambulatory Visit | Attending: Otolaryngology | Admitting: Otolaryngology

## 2021-10-09 DIAGNOSIS — E041 Nontoxic single thyroid nodule: Secondary | ICD-10-CM | POA: Diagnosis present

## 2021-10-10 LAB — CYTOLOGY - NON PAP

## 2021-11-14 DIAGNOSIS — K219 Gastro-esophageal reflux disease without esophagitis: Secondary | ICD-10-CM | POA: Diagnosis not present

## 2021-11-14 DIAGNOSIS — I2510 Atherosclerotic heart disease of native coronary artery without angina pectoris: Secondary | ICD-10-CM | POA: Diagnosis not present

## 2021-11-14 DIAGNOSIS — E1165 Type 2 diabetes mellitus with hyperglycemia: Secondary | ICD-10-CM | POA: Diagnosis not present

## 2021-11-14 DIAGNOSIS — Z803 Family history of malignant neoplasm of breast: Secondary | ICD-10-CM | POA: Diagnosis not present

## 2021-11-14 DIAGNOSIS — M791 Myalgia, unspecified site: Secondary | ICD-10-CM | POA: Diagnosis not present

## 2021-11-14 DIAGNOSIS — E782 Mixed hyperlipidemia: Secondary | ICD-10-CM | POA: Diagnosis not present

## 2021-11-14 DIAGNOSIS — M5136 Other intervertebral disc degeneration, lumbar region: Secondary | ICD-10-CM | POA: Diagnosis not present

## 2021-11-21 DIAGNOSIS — E1165 Type 2 diabetes mellitus with hyperglycemia: Secondary | ICD-10-CM | POA: Diagnosis not present

## 2021-11-21 DIAGNOSIS — I10 Essential (primary) hypertension: Secondary | ICD-10-CM | POA: Diagnosis not present

## 2021-11-21 DIAGNOSIS — E7800 Pure hypercholesterolemia, unspecified: Secondary | ICD-10-CM | POA: Diagnosis not present

## 2021-11-21 DIAGNOSIS — Z23 Encounter for immunization: Secondary | ICD-10-CM | POA: Diagnosis not present

## 2021-11-21 DIAGNOSIS — Z6834 Body mass index (BMI) 34.0-34.9, adult: Secondary | ICD-10-CM | POA: Diagnosis not present

## 2021-11-21 DIAGNOSIS — M791 Myalgia, unspecified site: Secondary | ICD-10-CM | POA: Diagnosis not present

## 2021-11-21 DIAGNOSIS — Z86010 Personal history of colonic polyps: Secondary | ICD-10-CM | POA: Diagnosis not present

## 2021-11-21 DIAGNOSIS — I493 Ventricular premature depolarization: Secondary | ICD-10-CM | POA: Diagnosis not present

## 2022-04-17 ENCOUNTER — Ambulatory Visit: Payer: Managed Care, Other (non HMO) | Admitting: Dermatology

## 2022-04-28 ENCOUNTER — Other Ambulatory Visit: Payer: Self-pay | Admitting: Internal Medicine

## 2022-04-28 DIAGNOSIS — Z1231 Encounter for screening mammogram for malignant neoplasm of breast: Secondary | ICD-10-CM

## 2022-06-12 ENCOUNTER — Ambulatory Visit
Admission: RE | Admit: 2022-06-12 | Discharge: 2022-06-12 | Disposition: A | Discharge status: Home / Self Care | Payer: 59 | Source: Ambulatory Visit | Attending: Internal Medicine | Admitting: Internal Medicine

## 2022-06-12 DIAGNOSIS — Z1231 Encounter for screening mammogram for malignant neoplasm of breast: Secondary | ICD-10-CM | POA: Insufficient documentation

## 2022-07-09 ENCOUNTER — Ambulatory Visit: Payer: 59 | Admitting: Dermatology

## 2022-07-09 VITALS — BP 150/89

## 2022-07-09 DIAGNOSIS — L821 Other seborrheic keratosis: Secondary | ICD-10-CM | POA: Diagnosis not present

## 2022-07-09 DIAGNOSIS — Z86018 Personal history of other benign neoplasm: Secondary | ICD-10-CM

## 2022-07-09 DIAGNOSIS — Z1283 Encounter for screening for malignant neoplasm of skin: Secondary | ICD-10-CM | POA: Diagnosis not present

## 2022-07-09 DIAGNOSIS — L814 Other melanin hyperpigmentation: Secondary | ICD-10-CM | POA: Diagnosis not present

## 2022-07-09 DIAGNOSIS — W908XXA Exposure to other nonionizing radiation, initial encounter: Secondary | ICD-10-CM

## 2022-07-09 DIAGNOSIS — L820 Inflamed seborrheic keratosis: Secondary | ICD-10-CM | POA: Diagnosis not present

## 2022-07-09 DIAGNOSIS — Z7189 Other specified counseling: Secondary | ICD-10-CM

## 2022-07-09 DIAGNOSIS — L304 Erythema intertrigo: Secondary | ICD-10-CM

## 2022-07-09 DIAGNOSIS — I781 Nevus, non-neoplastic: Secondary | ICD-10-CM

## 2022-07-09 DIAGNOSIS — L719 Rosacea, unspecified: Secondary | ICD-10-CM

## 2022-07-09 DIAGNOSIS — X32XXXA Exposure to sunlight, initial encounter: Secondary | ICD-10-CM

## 2022-07-09 DIAGNOSIS — L578 Other skin changes due to chronic exposure to nonionizing radiation: Secondary | ICD-10-CM

## 2022-07-09 DIAGNOSIS — D229 Melanocytic nevi, unspecified: Secondary | ICD-10-CM

## 2022-07-09 DIAGNOSIS — Z79899 Other long term (current) drug therapy: Secondary | ICD-10-CM

## 2022-07-09 DIAGNOSIS — D1801 Hemangioma of skin and subcutaneous tissue: Secondary | ICD-10-CM

## 2022-07-09 MED ORDER — IVERMECTIN 1 % EX CREA: 1.0000 | TOPICAL_CREAM | CUTANEOUS | 11 refills | Status: DC

## 2022-07-09 MED ORDER — IVERMECTIN 1 % EX CREA: 1.0000 | TOPICAL_CREAM | Freq: Every day | CUTANEOUS | 11 refills | Status: DC

## 2022-07-09 NOTE — Progress Notes (Signed)
Follow-Up Visit   Subjective  Rachel Obrien is a 67 y.o. female who presents for the following: Skin Cancer Screening and Full Body Skin Exam, hx of Dysplastic nevus, Rosacea face, metrogel in past  The patient presents for Total-Body Skin Exam (TBSE) for skin cancer screening and mole check. The patient has spots, moles and lesions to be evaluated, some may be new or changing and the patient has concerns that these could be cancer.    The following portions of the chart were reviewed this encounter and updated as appropriate: medications, allergies, medical history  Review of Systems:  No other skin or systemic complaints except as noted in HPI or Assessment and Plan.  Objective  Well appearing patient in no apparent distress; mood and affect are within normal limits.  A full examination was performed including scalp, head, eyes, ears, nose, lips, neck, chest, axillae, abdomen, back, buttocks, bilateral upper extremities, bilateral lower extremities, hands, feet, fingers, toes, fingernails, and toenails. All findings within normal limits unless otherwise noted below.   Relevant physical exam findings are noted in the Assessment and Plan.  R face x 2, Buttocks crease x 1 (3) Stuck on waxy paps with erythema    Assessment & Plan   LENTIGINES, SEBORRHEIC KERATOSES, HEMANGIOMAS - Benign normal skin lesions - Benign-appearing - Call for any changes  MELANOCYTIC NEVI - Tan-brown and/or pink-flesh-colored symmetric macules and papules - Benign appearing on exam today - Observation - Call clinic for new or changing moles - Recommend daily use of broad spectrum spf 30+ sunscreen to sun-exposed areas.   ACTINIC DAMAGE - Chronic condition, secondary to cumulative UV/sun exposure - diffuse scaly erythematous macules with underlying dyspigmentation - Recommend daily broad spectrum sunscreen SPF 30+ to sun-exposed areas, reapply every 2 hours as needed.  - Staying in the shade or  wearing long sleeves, sun glasses (UVA+UVB protection) and wide brim hats (4-inch brim around the entire circumference of the hat) are also recommended for sun protection.  - Call for new or changing lesions.  SKIN CANCER SCREENING PERFORMED TODAY.  HISTORY OF DYSPLASTIC NEVUS No evidence of recurrence today Recommend regular full body skin exams Recommend daily broad spectrum sunscreen SPF 30+ to sun-exposed areas, reapply every 2 hours as needed.  Call if any new or changing lesions are noted between office visits  - L lat deltoid, L post lat prox thigh  Inflamed seborrheic keratosis (3) R face x 2, Buttocks crease x 1  Symptomatic, irritating, patient would like treated.   Destruction of lesion - R face x 2, Buttocks crease x 1 Complexity: simple   Destruction method: cryotherapy   Informed consent: discussed and consent obtained   Timeout:  patient name, date of birth, surgical site, and procedure verified Lesion destroyed using liquid nitrogen: Yes   Region frozen until ice ball extended beyond lesion: Yes   Outcome: patient tolerated procedure well with no complications   Post-procedure details: wound care instructions given     ROSACEA Exam Mid face erythema with telangiectasias face with a few tiny paps    Rosacea is a chronic progressive skin condition usually affecting the face of adults, causing redness and/or acne bumps. It is treatable but not curable. It sometimes affects the eyes (ocular rosacea) as well. It may respond to topical and/or systemic medication and can flare with stress, sun exposure, alcohol, exercise, topical steroids (including hydrocortisone/cortisone 10) and some foods.  Daily application of broad spectrum spf 30+ sunscreen to face is recommended  to reduce flares.   Treatment Plan Start [facility] Triple Cream qhs   INTERTRIGO Exam Erythema lower abdomen   Intertrigo is a chronic recurrent rash that occurs in skin fold areas that may  be associated with friction; heat; moisture; yeast; fungus; and bacteria.  It is exacerbated by increased movement / activity; sweating; and higher atmospheric temperature.  Treatment Plan No treatment at this time    TELANGIECTASIA Exam: dilated vessel, blanches with diascopy without features suspicious for malignancy on dermoscopy   Treatment Plan:  Counseling for BBL / IPL / Laser and Coordination of Care Discussed the treatment option of Broad Band Light (BBL) /Intense Pulsed Light (IPL)/ Laser for skin discoloration, including brown spots and redness.  Typically we recommend at least 1-3 treatment sessions about 5-8 weeks apart for best results.  Cannot have tanned skin when BBL performed, and regular use of sunscreen is advised after the procedure to help maintain results. The patient's condition may also require "maintenance treatments" in the future.  The fee for BBL / laser treatments is $350 per treatment session for the whole face.  A fee can be quoted for other parts of the body.  Insurance typically does not pay for BBL/laser treatments and therefore the fee is an out-of-pocket cost.  Discussed $200 per txt session for 1 spot  Return in about 1 year (around 07/09/2023) for TBSE, Hx of Dysplastic nevi.  I, Ardis Rowan, RMA, am acting as scribe for Armida Sans, MD .   Documentation: I have reviewed the above documentation for accuracy and completeness, and I agree with the above.  Armida Sans, MD

## 2022-07-09 NOTE — Patient Instructions (Addendum)
Cryotherapy Aftercare  Wash gently with soap and water everyday.   Apply Vaseline and Band-Aid daily until healed.   Instructions for [facility] Medications  One or more of your medications was sent to the [facility] mail order compounding pharmacy. You will receive an email from them and can purchase the medicine through that link. It will then be mailed to your home at the address you confirmed. If for any reason you do not receive an email from them, please check your spam folder. If you still do not find the email, please let us know. [facility] phone number is [phone].   Due to recent changes in healthcare laws, you may see results of your pathology and/or laboratory studies on MyChart before the doctors have had a chance to review them. We understand that in some cases there may be results that are confusing or concerning to you. Please understand that not all results are received at the same time and often the doctors may need to interpret multiple results in order to provide you with the best plan of care or course of treatment. Therefore, we ask that you please give us 2 business days to thoroughly review all your results before contacting the office for clarification. Should we see a critical lab result, you will be contacted sooner.   If You Need Anything After Your Visit  If you have any questions or concerns for your doctor, please call our main line at [phone] and press option 4 to reach your doctor's medical assistant. If no one answers, please leave a voicemail as directed and we will return your call as soon as possible. Messages left after 4 pm will be answered the following business day.   You may also send us a message via MyChart. We typically respond to MyChart messages within 1-2 business days.  For prescription refills, please ask your pharmacy to contact our office. Our fax number is [phone].  If you have an urgent issue when the clinic is  closed that cannot wait until the next business day, you can page your doctor at the number below.    Please note that while we do our best to be available for urgent issues outside of office hours, we are not available 24/7.   If you have an urgent issue and are unable to reach us, you may choose to seek medical care at your doctor's office, retail clinic, urgent care center, or emergency room.  If you have a medical emergency, please immediately call 911 or go to the emergency department.  Pager Numbers  - Dr. Kowalski: [phone]  - Dr. Moye: [phone]  - Dr. Stewart: [phone]  In the event of inclement weather, please call our main line at [phone] for an update on the status of any delays or closures.  Dermatology Medication Tips: Please keep the boxes that topical medications come in in order to help keep track of the instructions about where and how to use these. Pharmacies typically print the medication instructions only on the boxes and not directly on the medication tubes.   If your medication is too expensive, please contact our office at [phone] option 4 or send us a message through MyChart.   We are unable to tell what your co-pay for medications will be in advance as this is different depending on your insurance coverage. However, we may be able to find a substitute medication at lower cost or fill out paperwork to get insurance to cover a needed   medication.   If a prior authorization is required to get your medication covered by your insurance company, please allow us 1-2 business days to complete this process.  Drug prices often vary depending on where the prescription is filled and some pharmacies may offer cheaper prices.  The website www.goodrx.com contains coupons for medications through different pharmacies. The prices here do not account for what the cost may be with help from insurance (it may be cheaper with your insurance), but the website can  give you the price if you did not use any insurance.  - You can print the associated coupon and take it with your prescription to the pharmacy.  - You may also stop by our office during regular business hours and pick up a GoodRx coupon card.  - If you need your prescription sent electronically to a different pharmacy, notify our office through [place] MyChart or by phone at [phone] option 4.     Si Usted Necesita Algo Despus de Su Visita  Tambin puede enviarnos un mensaje a travs de MyChart. Por lo general respondemos a los mensajes de MyChart en el transcurso de 1 a 2 das hbiles.  Para renovar recetas, por favor pida a su farmacia que se ponga en contacto con nuestra oficina. Nuestro nmero de fax es el [phone].  Si tiene un asunto urgente cuando la clnica est cerrada y que no puede esperar hasta el siguiente da hbil, puede llamar/localizar a su doctor(a) al nmero que aparece a continuacin.   Por favor, tenga en cuenta que aunque hacemos todo lo posible para estar disponibles para asuntos urgentes fuera del horario de oficina, no estamos disponibles las 24 horas del da, los 7 das de la semana.   Si tiene un problema urgente y no puede comunicarse con nosotros, puede optar por buscar atencin mdica  en el consultorio de su doctor(a), en una clnica privada, en un centro de atencin urgente o en una sala de emergencias.  Si tiene una emergencia mdica, por favor llame inmediatamente al 911 o vaya a la sala de emergencias.  Nmeros de bper  - Dr. Kowalski: [phone]  - Dra. Moye: [phone]  - Dra. Stewart: [phone]  En caso de inclemencias del tiempo, por favor llame a nuestra lnea principal al [phone] para una actualizacin sobre el estado de cualquier retraso o cierre.  Consejos para la medicacin en dermatologa: Por favor, guarde las cajas en las que vienen los medicamentos de uso tpico para ayudarle a seguir las instrucciones sobre  dnde y cmo usarlos. Las farmacias generalmente imprimen las instrucciones del medicamento slo en las cajas y no directamente en los tubos del medicamento.   Si su medicamento es muy caro, por favor, pngase en contacto con nuestra oficina llamando al [phone] y presione la opcin 4 o envenos un mensaje a travs de MyChart.   No podemos decirle cul ser su copago por los medicamentos por adelantado ya que esto es diferente dependiendo de la cobertura de su seguro. Sin embargo, es posible que podamos encontrar un medicamento sustituto a menor costo o llenar un formulario para que el seguro cubra el medicamento que se considera necesario.   Si se requiere una autorizacin previa para que su compaa de seguros cubra su medicamento, por favor permtanos de 1 a 2 das hbiles para completar este proceso.  Los precios de los medicamentos varan con frecuencia dependiendo del lugar de dnde se surte la receta y alguna farmacias pueden ofrecer precios ms baratos.  El   sitio web www.goodrx.com tiene cupones para medicamentos de diferentes farmacias. Los precios aqu no tienen en cuenta lo que podra costar con la ayuda del seguro (puede ser ms barato con su seguro), pero el sitio web puede darle el precio si no utiliz ningn seguro.  - Puede imprimir el cupn correspondiente y llevarlo con su receta a la farmacia.  - Tambin puede pasar por nuestra oficina durante el horario de atencin regular y recoger una tarjeta de cupones de GoodRx.  - Si necesita que su receta se enve electrnicamente a una farmacia diferente, informe a nuestra oficina a travs de MyChart de [place] o por telfono llamando al [phone] y presione la opcin 4.  

## 2022-07-15 ENCOUNTER — Encounter: Payer: Self-pay | Admitting: Dermatology

## 2022-07-25 ENCOUNTER — Other Ambulatory Visit: Payer: Self-pay | Admitting: Internal Medicine

## 2022-07-25 DIAGNOSIS — R1084 Generalized abdominal pain: Secondary | ICD-10-CM

## 2022-07-31 ENCOUNTER — Other Ambulatory Visit: Payer: Self-pay | Admitting: Gastroenterology

## 2022-07-31 DIAGNOSIS — R1013 Epigastric pain: Secondary | ICD-10-CM

## 2022-07-31 DIAGNOSIS — K581 Irritable bowel syndrome with constipation: Secondary | ICD-10-CM

## 2022-08-15 ENCOUNTER — Encounter
Admission: RE | Admit: 2022-08-15 | Discharge: 2022-08-15 | Disposition: A | Discharge status: Home / Self Care | Payer: Medicare HMO | Source: Ambulatory Visit | Attending: Gastroenterology | Admitting: Gastroenterology

## 2022-08-15 DIAGNOSIS — K581 Irritable bowel syndrome with constipation: Secondary | ICD-10-CM | POA: Diagnosis present

## 2022-08-15 DIAGNOSIS — R1013 Epigastric pain: Secondary | ICD-10-CM

## 2022-08-15 MED ORDER — TECHNETIUM TC 99M SULFUR COLLOID
2.0000 | Freq: Once | INTRAVENOUS | Status: AC
  Administered 2022-08-15: 2.35 via ORAL

## 2023-06-01 ENCOUNTER — Other Ambulatory Visit: Payer: Self-pay | Admitting: Internal Medicine

## 2023-06-01 DIAGNOSIS — Z1231 Encounter for screening mammogram for malignant neoplasm of breast: Secondary | ICD-10-CM

## 2023-06-03 ENCOUNTER — Other Ambulatory Visit: Payer: Self-pay | Admitting: Otolaryngology

## 2023-06-03 DIAGNOSIS — E041 Nontoxic single thyroid nodule: Secondary | ICD-10-CM

## 2023-06-11 ENCOUNTER — Ambulatory Visit
Admission: RE | Admit: 2023-06-11 | Discharge: 2023-06-11 | Disposition: A | Discharge status: Home / Self Care | Source: Ambulatory Visit | Attending: Otolaryngology | Admitting: Otolaryngology

## 2023-06-11 DIAGNOSIS — E041 Nontoxic single thyroid nodule: Secondary | ICD-10-CM

## 2023-06-15 ENCOUNTER — Ambulatory Visit
Admission: RE | Admit: 2023-06-15 | Discharge: 2023-06-15 | Disposition: A | Discharge status: Home / Self Care | Source: Ambulatory Visit | Attending: Internal Medicine | Admitting: Internal Medicine

## 2023-06-15 DIAGNOSIS — Z1231 Encounter for screening mammogram for malignant neoplasm of breast: Secondary | ICD-10-CM | POA: Diagnosis present

## 2023-06-30 ENCOUNTER — Other Ambulatory Visit: Payer: Self-pay | Admitting: Internal Medicine

## 2023-06-30 DIAGNOSIS — R0789 Other chest pain: Secondary | ICD-10-CM

## 2023-06-30 DIAGNOSIS — R140 Abdominal distension (gaseous): Secondary | ICD-10-CM

## 2023-07-01 ENCOUNTER — Other Ambulatory Visit: Payer: Self-pay | Admitting: Internal Medicine

## 2023-07-01 DIAGNOSIS — R0789 Other chest pain: Secondary | ICD-10-CM

## 2023-07-01 DIAGNOSIS — R140 Abdominal distension (gaseous): Secondary | ICD-10-CM

## 2023-07-03 ENCOUNTER — Ambulatory Visit
Admission: RE | Admit: 2023-07-03 | Discharge: 2023-07-03 | Disposition: A | Discharge status: Home / Self Care | Source: Ambulatory Visit | Attending: Internal Medicine | Admitting: Internal Medicine

## 2023-07-03 DIAGNOSIS — R140 Abdominal distension (gaseous): Secondary | ICD-10-CM | POA: Diagnosis present

## 2023-07-03 DIAGNOSIS — R0789 Other chest pain: Secondary | ICD-10-CM | POA: Insufficient documentation

## 2023-07-15 ENCOUNTER — Ambulatory Visit: Payer: 59 | Admitting: Dermatology

## 2023-07-15 ENCOUNTER — Encounter: Payer: Self-pay | Admitting: Dermatology

## 2023-07-15 DIAGNOSIS — D1801 Hemangioma of skin and subcutaneous tissue: Secondary | ICD-10-CM

## 2023-07-15 DIAGNOSIS — D485 Neoplasm of uncertain behavior of skin: Secondary | ICD-10-CM

## 2023-07-15 DIAGNOSIS — Z7189 Other specified counseling: Secondary | ICD-10-CM

## 2023-07-15 DIAGNOSIS — D492 Neoplasm of unspecified behavior of bone, soft tissue, and skin: Secondary | ICD-10-CM

## 2023-07-15 DIAGNOSIS — L578 Other skin changes due to chronic exposure to nonionizing radiation: Secondary | ICD-10-CM

## 2023-07-15 DIAGNOSIS — Z1283 Encounter for screening for malignant neoplasm of skin: Secondary | ICD-10-CM | POA: Diagnosis not present

## 2023-07-15 DIAGNOSIS — W908XXA Exposure to other nonionizing radiation, initial encounter: Secondary | ICD-10-CM | POA: Diagnosis not present

## 2023-07-15 DIAGNOSIS — D225 Melanocytic nevi of trunk: Secondary | ICD-10-CM

## 2023-07-15 DIAGNOSIS — L821 Other seborrheic keratosis: Secondary | ICD-10-CM

## 2023-07-15 DIAGNOSIS — L719 Rosacea, unspecified: Secondary | ICD-10-CM

## 2023-07-15 DIAGNOSIS — L304 Erythema intertrigo: Secondary | ICD-10-CM

## 2023-07-15 DIAGNOSIS — L820 Inflamed seborrheic keratosis: Secondary | ICD-10-CM | POA: Diagnosis not present

## 2023-07-15 DIAGNOSIS — Z79899 Other long term (current) drug therapy: Secondary | ICD-10-CM

## 2023-07-15 DIAGNOSIS — I781 Nevus, non-neoplastic: Secondary | ICD-10-CM

## 2023-07-15 DIAGNOSIS — L814 Other melanin hyperpigmentation: Secondary | ICD-10-CM

## 2023-07-15 DIAGNOSIS — Z86018 Personal history of other benign neoplasm: Secondary | ICD-10-CM

## 2023-07-15 MED ORDER — IVERMECTIN 1 % EX CREA: 1.0000 | TOPICAL_CREAM | CUTANEOUS | 11 refills | Status: AC

## 2023-07-15 NOTE — Progress Notes (Signed)
 Follow-Up Visit   Subjective  Rachel Obrien is a 68 y.o. female who presents for the following: Skin Cancer Screening and Full Body Skin Exam  The patient presents for Total-Body Skin Exam (TBSE) for skin cancer screening and mole check. The patient has spots, moles and lesions to be evaluated, some may be new or changing and the patient may have concern these could be cancer.   The following portions of the chart were reviewed this encounter and updated as appropriate: medications, allergies, medical history  Review of Systems:  No other skin or systemic complaints except as noted in HPI or Assessment and Plan.  Objective  Well appearing patient in no apparent distress; mood and affect are within normal limits.  A full examination was performed including scalp, head, eyes, ears, nose, lips, neck, chest, axillae, abdomen, back, buttocks, bilateral upper extremities, bilateral lower extremities, hands, feet, fingers, toes, fingernails, and toenails. All findings within normal limits unless otherwise noted below.   Relevant physical exam findings are noted in the Assessment and Plan.  R ant deltoid 0.8 x 0.5 cm keratotic papule.  R post axillary fold 0.6 cm tan waxy papule.  R mid back lat near side 0.6 cm two toned irregular brown macule.  R inf breast x 1, mid back x 2, R breast x 1 (3) Stuck on waxy paps with erythema  Assessment & Plan   SKIN CANCER SCREENING PERFORMED TODAY.  ACTINIC DAMAGE - Chronic condition, secondary to cumulative UV/sun exposure - diffuse scaly erythematous macules with underlying dyspigmentation - Recommend daily broad spectrum sunscreen SPF 30+ to sun-exposed areas, reapply every 2 hours as needed.  - Staying in the shade or wearing long sleeves, sun glasses (UVA+UVB protection) and wide brim hats (4-inch brim around the entire circumference of the hat) are also recommended for sun protection.  - Call for new or changing  lesions.  LENTIGINES, SEBORRHEIC KERATOSES, HEMANGIOMAS - Benign normal skin lesions - Benign-appearing - Call for any changes  MELANOCYTIC NEVI - Tan-brown and/or pink-flesh-colored symmetric macules and papules - Benign appearing on exam today - Observation - Call clinic for new or changing moles - Recommend daily use of broad spectrum spf 30+ sunscreen to sun-exposed areas.   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 It is treatable but not curable. It sometimes affects the eyes (ocular rosacea) as well. It may respond to topical and/or systemic medication and can flare with stress, sun exposure, alcohol, exercise, topical steroids (including hydrocortisone/cortisone 10) and some foods.  Daily application of broad spectrum spf 30+ sunscreen to face is recommended to reduce flares. Treatment Plan Start Skin Medicinals Triple Cream qhs    INTERTRIGO Exam Erythema lower abdomen Intertrigo is a chronic recurrent rash that occurs in skin fold areas that may be associated with friction; heat; moisture; yeast; fungus; and bacteria.  It is exacerbated by increased movement / activity; sweating; and higher atmospheric temperature. Treatment Plan No treatment at this time    TELANGIECTASIA Exam: dilated vessel, blanches with diascopy without features suspicious  for malignancy on dermoscopy  Treatment Plan:  Counseling for BBL / IPL / Laser and Coordination of Care Discussed the treatment option of Broad Band Light  (BBL) /Intense Pulsed Light (IPL)/ Laser for skin discoloration, including brown spots and redness.  Typically we recommend at least 1-3 treatment sessions about 5-8 weeks apart for best results.  Cannot have tanned skin when BBL performed, and regular use of sunscreen is advised after the procedure to help maintain results. The patient's condition may also require "maintenance treatments" in the future.  The fee for BBL / laser treatments is $350 per treatment session for the whole face.  A fee can be quoted for other parts of the body.  Insurance typically does not pay for BBL/laser treatments and therefore the fee is an out-of-pocket cost.   Discussed $200 per txt session for 1 spot  NEOPLASM OF UNCERTAIN BEHAVIOR OF SKIN (3) R ant deltoid Epidermal / dermal shaving  Lesion diameter (cm):  0.8 Informed consent: discussed and consent obtained   Timeout: patient name, date of birth, surgical site, and procedure verified   Procedure prep:  Patient was prepped and draped in usual sterile fashion Prep type:  Isopropyl alcohol Anesthesia: the lesion was anesthetized in a standard fashion   Anesthetic:  1% lidocaine  w/ epinephrine 1-100,000 buffered w/ 8.4% NaHCO3 Instrument used: flexible razor blade   Hemostasis achieved with: pressure, aluminum chloride and electrodesiccation   Outcome: patient tolerated procedure well   Post-procedure details: sterile dressing applied and wound care instructions given   Dressing type: bandage (Mupirocin 2% ointment)    Destruction of lesion Complexity: extensive   Destruction method: electrodesiccation and curettage   Informed consent: discussed and consent obtained   Timeout:  patient name, date of birth, surgical site, and procedure verified Procedure prep:  Patient was prepped and draped in usual sterile fashion Prep type:  Isopropyl alcohol Anesthesia: the lesion was anesthetized in a standard fashion   Anesthetic:  1% lidocaine  w/ epinephrine  1-100,000 buffered w/ 8.4% NaHCO3 Curettage performed in three different directions: Yes   Electrodesiccation performed over the curetted area: Yes   Lesion length (cm):  0.8 Lesion width (cm):  0.8 Hemostasis achieved with:  pressure, aluminum chloride and electrodesiccation Outcome: patient tolerated procedure well with no complications   Post-procedure details: sterile dressing applied and wound care instructions given   Dressing type: bandage and petrolatum   Specimen 1 - Surgical pathology Differential Diagnosis: D48.5 ISK vs SCC Check Margins: yes R post axillary fold Epidermal / dermal shaving  Lesion diameter (cm):  0.6 Informed consent: discussed and consent obtained   Timeout: patient name, date of birth, surgical site, and procedure verified   Procedure prep:  Patient was prepped and draped in usual sterile fashion Prep type:  Isopropyl alcohol Anesthesia: the lesion was anesthetized in a standard fashion   Anesthetic:  1% lidocaine  w/ epinephrine 1-100,000 buffered w/ 8.4% NaHCO3 Instrument used: flexible razor blade   Hemostasis achieved with: pressure, aluminum chloride and electrodesiccation   Outcome: patient tolerated procedure well   Post-procedure details: sterile dressing applied and wound care instructions given   Dressing type: bandage (Mupirocin 2% ointment)   Specimen 2 - Surgical pathology Differential Diagnosis: D48.5 irritated nevus vs ISK r/o dysplastic nevus Check Margins: Yes R mid back lat near side Epidermal / dermal shaving  Lesion diameter (cm):  0.6 Informed consent: discussed and consent obtained   Timeout: patient name, date of birth, surgical site, and procedure verified  Procedure prep:  Patient was prepped and draped in usual sterile fashion Prep type:  Isopropyl alcohol Anesthesia: the lesion was anesthetized in a standard fashion   Anesthetic:  1% lidocaine  w/ epinephrine 1-100,000 buffered w/ 8.4% NaHCO3 Instrument used: flexible  razor blade   Hemostasis achieved with: pressure, aluminum chloride and electrodesiccation   Outcome: patient tolerated procedure well   Post-procedure details: sterile dressing applied and wound care instructions given   Dressing type: bandage (Mupirocin 2% ointment)   Specimen 3 - Surgical pathology Differential Diagnosis: D48.5 r/o dysplastic nevus Check Margins: Yes INFLAMED SEBORRHEIC KERATOSIS (3) R inf breast x 1, mid back x 2, R breast x 1 (3) Symptomatic, irritating, patient would like treated.  Destruction of lesion - R inf breast x 1, mid back x 2, R breast x 1 (3) Complexity: simple   Destruction method: cryotherapy   Informed consent: discussed and consent obtained   Timeout:  patient name, date of birth, surgical site, and procedure verified Lesion destroyed using liquid nitrogen: Yes   Region frozen until ice ball extended beyond lesion: Yes   Outcome: patient tolerated procedure well with no complications   Post-procedure details: wound care instructions given   SKIN CANCER SCREENING   ACTINIC SKIN DAMAGE   HISTORY OF DYSPLASTIC NEVUS   ROSACEA   COUNSELING AND COORDINATION OF CARE   MEDICATION MANAGEMENT   INTERTRIGO   TELANGIECTASIA    Return in about 1 year (around 07/14/2024) for TBSE - hx dysplastic nevus.  Arlinda Lais, CMA, am acting as scribe for Celine Collard, MD .   Documentation: I have reviewed the above documentation for accuracy and completeness, and I agree with the above.  Celine Collard, MD

## 2023-07-15 NOTE — Patient Instructions (Signed)

## 2023-07-16 ENCOUNTER — Encounter: Payer: Self-pay | Admitting: Dermatology

## 2023-07-20 ENCOUNTER — Ambulatory Visit: Payer: Self-pay | Admitting: Dermatology

## 2023-07-20 LAB — SURGICAL PATHOLOGY

## 2023-07-21 ENCOUNTER — Encounter: Payer: Self-pay | Admitting: Dermatology

## 2023-07-21 NOTE — Telephone Encounter (Addendum)
 Tried calling patient regarding results. No answer. LM for patient to return call.   ----- Message from Celine Collard sent at 07/20/2023  6:41 PM EDT ----- FINAL DIAGNOSIS        1. Skin, R ant deltoid :       SEBORRHEIC KERATOSIS, IRRITATED, LIMITED MARGINS FREE        2. Skin, R post axillary fold :       MELANOCYTIC NEVUS, INTRADERMAL TYPE, BASE INVOLVED        3. 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

## 2023-07-21 NOTE — Telephone Encounter (Signed)
 Patient advised of BX results. She leaves within the next two weeks to live in [place]  until next April. How would you like for her to proceed?

## 2023-09-01 NOTE — Telephone Encounter (Signed)
Patient advised of information per Dr. Kowalski. aw 

## 2023-09-01 NOTE — Telephone Encounter (Signed)
 Left message for patient to return my call. Aw

## 2023-09-01 NOTE — Telephone Encounter (Signed)
-----   Message from Alm Rhyme sent at 08/27/2023  2:15 PM EDT ----- Recommend re-check area in around 6 mos, but no later than Feb 2026 ----- Message ----- From: Teresa Alan SAUNDERS, CMA Sent: 07/21/2023  11:39 AM EDT To: Alm JAYSON Rhyme, MD

## 2024-07-20 ENCOUNTER — Ambulatory Visit: Admitting: Dermatology

## 2024-08-01 ENCOUNTER — Ambulatory Visit: Admitting: Dermatology
